# Patient Record
Sex: Male | Born: 1960
Health system: Southern US, Community
[De-identification: ages and names within clinical notes are randomized; demographics above are authoritative.]

## PROBLEM LIST (undated history)

## (undated) DIAGNOSIS — H538 Other visual disturbances: Secondary | ICD-10-CM

## (undated) DIAGNOSIS — K625 Hemorrhage of anus and rectum: Secondary | ICD-10-CM

## (undated) DIAGNOSIS — N189 Chronic kidney disease, unspecified: Secondary | ICD-10-CM

## (undated) DIAGNOSIS — K5790 Diverticulosis of intestine, part unspecified, without perforation or abscess without bleeding: Secondary | ICD-10-CM

## (undated) DIAGNOSIS — C61 Malignant neoplasm of prostate: Secondary | ICD-10-CM

## (undated) DIAGNOSIS — I10 Essential (primary) hypertension: Secondary | ICD-10-CM

## (undated) DIAGNOSIS — K649 Unspecified hemorrhoids: Secondary | ICD-10-CM

## (undated) HISTORY — PX: OTHER SURGICAL HISTORY: SHX169

## (undated) HISTORY — DX: Unspecified hemorrhoids: K64.9

## (undated) HISTORY — PX: CIRCUMCISION: SUR203

## (undated) HISTORY — DX: Hemorrhage of anus and rectum: K62.5

## (undated) HISTORY — DX: Morbid (severe) obesity due to excess calories: E66.01

## (undated) HISTORY — DX: Other visual disturbances: H53.8

## (undated) HISTORY — DX: Diverticulosis of intestine, part unspecified, without perforation or abscess without bleeding: K57.90

---

## 2006-06-28 ENCOUNTER — Emergency Department (HOSPITAL_COMMUNITY): Admission: EM | Admit: 2006-06-28 | Discharge: 2006-06-28 | Payer: Self-pay | Admitting: *Deleted

## 2011-08-08 ENCOUNTER — Encounter (HOSPITAL_COMMUNITY): Payer: Self-pay | Admitting: Emergency Medicine

## 2011-08-08 ENCOUNTER — Emergency Department (HOSPITAL_COMMUNITY): Payer: Self-pay

## 2011-08-08 ENCOUNTER — Emergency Department (HOSPITAL_COMMUNITY)
Admission: EM | Admit: 2011-08-08 | Discharge: 2011-08-08 | Disposition: A | Discharge status: Home / Self Care | Payer: Self-pay | Attending: Emergency Medicine | Admitting: Emergency Medicine

## 2011-08-08 DIAGNOSIS — M25469 Effusion, unspecified knee: Secondary | ICD-10-CM | POA: Insufficient documentation

## 2011-08-08 DIAGNOSIS — S8990XA Unspecified injury of unspecified lower leg, initial encounter: Secondary | ICD-10-CM

## 2011-08-08 DIAGNOSIS — M25569 Pain in unspecified knee: Secondary | ICD-10-CM | POA: Insufficient documentation

## 2011-08-08 MED ORDER — HYDROCODONE-ACETAMINOPHEN 5-325 MG PO TABS: 1.0000 | ORAL_TABLET | ORAL | Status: AC | PRN

## 2011-08-08 NOTE — Discharge Instructions (Signed)
Knee Effusion  Knee effusion means you have fluid in your knee. The knee may be more difficult to bend and move. HOME CARE  Use crutches or a brace as told by your doctor.   Put ice on the injured area.   Put ice in a plastic bag.   Place a towel between your skin and the bag.   Leave the ice on for 15 to 20 minutes, 3 to 4 times a day.   Raise (elevate) your knee as much as possible.   Only take medicine as told by your doctor.   You may need to do strengthening exercises. Ask your doctor.   Continue with your normal diet and activities as told by your doctor.  GET HELP RIGHT AWAY IF:  You have more puffiness (swelling) in your knee.   You see redness, puffiness, or have more pain in your knee.   You have a temperature by mouth above 102 F (38.9 C).   You get a rash.   You have trouble breathing.   You have a reaction to any medicine you are taking.   You have a lot of pain when you move your knee.  MAKE SURE YOU:  Understand these instructions.   Will watch your condition.   Will get help right away if you are not doing well or get worse.  Document Released: 03/21/2010 Document Revised: 02/05/2011 Document Reviewed: 03/21/2010 [employer] Patient Information 2012 [employer]. 

## 2011-08-08 NOTE — ED Notes (Signed)
Pt. Stated, knee pain that started on tuesday

## 2011-08-08 NOTE — ED Notes (Signed)
Pt d/c home in NAD. Pt voiced understanding of d/c instructions and follow up care.  

## 2011-08-08 NOTE — ED Notes (Signed)
Paged Ortho  

## 2011-08-08 NOTE — ED Provider Notes (Signed)
History   This chart was scribed for Flint Melter, MD by Shari Heritage. The patient was seen in room STRE4/STRE4. Patient's care was started at 1355.     CSN: [id number]  Arrival date & time 08/08/11  1355   First MD Initiated Contact with Patient 08/08/11 1532      Chief Complaint  Patient presents with  . Knee Pain    (Consider location/radiation/quality/duration/timing/severity/associated sxs/prior treatment) Patient is a 51 y.o. male presenting with knee pain. The history is provided by the patient. No language interpreter was used.  Knee Pain   Charles [name] is a 51 y.o. male who presents to the Emergency Department complaining of moderate to severe, constant right knee pain onset 4 days ago. Patient also complains of some swelling in knee. Patient denies any trauma or injury to the knee. 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 Patient reports no other pertinent medical or surgical history.  History reviewed. No pertinent past medical history.  History reviewed. No pertinent past surgical history.  No family history on file.  History  Substance Use Topics  . Smoking status: Not on file  . Smokeless tobacco: Not on file  . Alcohol Use: 0.6 oz/week    1 Cans of beer per week      Review of Systems A complete 10 system review of systems was obtained and all systems are negative except as noted in the HPI and PMH.   Allergies  Review of patient's allergies indicates no known allergies.  Home Medications   Current Outpatient Rx  Name Route Sig Dispense Refill  . HYDROCODONE-ACETAMINOPHEN 5-325 MG PO TABS Oral Take 1 tablet by mouth every 4 (four) hours as needed for pain. 10 tablet 0    BP 121/72  Pulse 76  Temp(Src) 98.2 F (36.8 C) (Oral)  Resp 16  SpO2 97%  Physical Exam  Nursing note and vitals reviewed. Constitutional: He is oriented to person, place, and time. He appears well-developed and well-nourished. No  distress.  HENT:  Head: Normocephalic and atraumatic.  Eyes: Conjunctivae and EOM are normal.  Neck: Neck supple. No tracheal deviation present.  Cardiovascular: Normal rate.   Pulmonary/Chest: Effort normal. No respiratory distress.  Abdominal: He exhibits no distension.  Musculoskeletal: Normal range of motion. He exhibits tenderness.       Small effusion in right leg surrounding the knee. Diffuse anterior tenderness and pain with passive flexion. No posterior tenderness. Grossly stable.  Neurological: He is alert and oriented to person, place, and time. No sensory deficit.  Skin: Skin is dry.  Psychiatric: He has a normal mood and affect. His behavior is normal.    ED Course  Procedures (including critical care time) DIAGNOSTIC STUDIES: Oxygen Saturation is 97% on room air, adequate by my interpretation.    COORDINATION OF CARE: 3:53PM - Patient informed of current plan for treatment and evaluation and agrees with plan at this time. Will order X-ray of right leg.   Emergency department treatment: He was offered a knee immobilizer, but declined. A knee sleeve is applied  Labs Reviewed - No data to display Dg Knee Complete 4 Views Right  08/08/2011  *RADIOLOGY REPORT*  Clinical Data: Pain and swelling in the right knee.  No known injury.  RIGHT KNEE - COMPLETE 4+ VIEW  Comparison: None.  Findings: The knee is aligned.  There is a tiny patellar osteophyte.  The medial and lateral joint spaces are maintained. No fracture, focal  bony abnormality, or joint effusion is identified.  There is suggestion of some soft tissue swelling in the infrapatellar region.  IMPRESSION:  1.  No acute bony abnormality or joint effusion. 2.  Tiny patellar osteophyte. 3.  Question soft tissue swelling inferior to the patella.  Original Report Authenticated By: Britta Mccreedy, M.D.     1. Knee injuries       MDM  Nonspecific knee effusion, probably related to an occult injury. Doubt fracture, or septic  arthritis DVT or radiculopathy. Doubt metabolic instability, serious bacterial infection or impending vascular collapse; the patient is stable for discharge.      I personally performed the services described in this documentation, which was scribed in my presence. The recorded information has been reviewed and considered.   Plan: Home Medications- Norco; Home Treatments- avoid knee stress; Recommended follow up- Ortho prn  Flint Melter, MD 08/08/11 2024

## 2011-08-08 NOTE — Progress Notes (Signed)
Orthopedic Tech Progress Note Patient Details:  Charles [name] 1960-04-04 [id number]  Ortho Devices Type of Ortho Device: Knee Sleeve Ortho Device/Splint Location: right knee Ortho Device/Splint Interventions: Freeman Caldron, Briannia Laba 08/08/2011, 5:19 PM

## 2015-02-06 ENCOUNTER — Encounter: Payer: Self-pay | Admitting: Internal Medicine

## 2015-03-20 ENCOUNTER — Encounter (HOSPITAL_COMMUNITY): Payer: Self-pay | Admitting: Emergency Medicine

## 2015-03-20 ENCOUNTER — Emergency Department (HOSPITAL_COMMUNITY)
Admission: EM | Admit: 2015-03-20 | Discharge: 2015-03-21 | Disposition: A | Discharge status: Home / Self Care | Payer: Commercial Managed Care - HMO | Attending: Emergency Medicine | Admitting: Emergency Medicine

## 2015-03-20 ENCOUNTER — Emergency Department (HOSPITAL_COMMUNITY): Payer: Commercial Managed Care - HMO

## 2015-03-20 DIAGNOSIS — N400 Enlarged prostate without lower urinary tract symptoms: Secondary | ICD-10-CM | POA: Insufficient documentation

## 2015-03-20 DIAGNOSIS — R599 Enlarged lymph nodes, unspecified: Secondary | ICD-10-CM | POA: Diagnosis not present

## 2015-03-20 DIAGNOSIS — K625 Hemorrhage of anus and rectum: Secondary | ICD-10-CM | POA: Diagnosis present

## 2015-03-20 LAB — COMPREHENSIVE METABOLIC PANEL
ALBUMIN: 3.9 g/dL (ref 3.5–5.0)
ALT: 19 U/L (ref 17–63)
ANION GAP: 10 (ref 5–15)
AST: 20 U/L (ref 15–41)
Alkaline Phosphatase: 59 U/L (ref 38–126)
BUN: 14 mg/dL (ref 6–20)
CHLORIDE: 107 mmol/L (ref 101–111)
CO2: 25 mmol/L (ref 22–32)
Calcium: 9.2 mg/dL (ref 8.9–10.3)
Creatinine, Ser: 1 mg/dL (ref 0.61–1.24)
GFR calc Af Amer: >60 mL/min (ref >60)
GFR calc non Af Amer: >60 mL/min (ref >60)
GLUCOSE: 110 mg/dL — AB (ref 65–99)
POTASSIUM: 3.8 mmol/L (ref 3.5–5.1)
SODIUM: 142 mmol/L (ref 135–145)
TOTAL PROTEIN: 6.7 g/dL (ref 6.5–8.1)
Total Bilirubin: 0.2 mg/dL — ABNORMAL LOW (ref 0.3–1.2)

## 2015-03-20 LAB — CBC
HEMATOCRIT: 38.5 % — AB (ref 39.0–52.0)
HEMOGLOBIN: 13.5 g/dL (ref 13.0–17.0)
MCH: 33.1 pg (ref 26.0–34.0)
MCHC: 35.1 g/dL (ref 30.0–36.0)
MCV: 94.4 fL (ref 78.0–100.0)
Platelets: 294 10*3/uL (ref 150–400)
RBC: 4.08 MIL/uL — ABNORMAL LOW (ref 4.22–5.81)
RDW: 12.7 % (ref 11.5–15.5)
WBC: 7.4 10*3/uL (ref 4.0–10.5)

## 2015-03-20 LAB — ABO/RH: ABO/RH(D): B POS

## 2015-03-20 LAB — TYPE AND SCREEN
ABO/RH(D): B POS
ANTIBODY SCREEN: NEGATIVE

## 2015-03-20 LAB — POC OCCULT BLOOD, ED: Fecal Occult Bld: NEGATIVE

## 2015-03-20 MED ORDER — SODIUM CHLORIDE 0.9 % IV SOLN
INTRAVENOUS | Status: DC
  Administered 2015-03-20: 20:00:00 via INTRAVENOUS

## 2015-03-20 MED ORDER — IOHEXOL 300 MG/ML  SOLN
100.0000 mL | Freq: Once | INTRAMUSCULAR | Status: AC | PRN
  Administered 2015-03-20: 100 mL via INTRAVENOUS

## 2015-03-20 NOTE — ED Provider Notes (Signed)
CSN: [id number]     Arrival date & time 03/20/15  1351 History   First MD Initiated Contact with Patient 03/20/15 2000     Chief Complaint  Patient presents with  . Rectal Bleeding      HPI Pt was seen at 2010. Per pt, c/o gradual onset and persistence of intermittent "rectal bleeding" for the past 6+ months. Pt states he will alternate between "red" rectal bleeding and "dark stools," as well as "rectal discharge." Denies any change in his symptoms. Denies new symptoms. Denies rectal pain, no rectal injury, no fecal incont, no abd pain, no N/V/D, no back pain, no fevers, no rash.    History reviewed. No pertinent past medical history.   History reviewed. No pertinent past surgical history.  Social History  Substance Use Topics  . Smoking status: Never Smoker   . Smokeless tobacco: None  . Alcohol Use: 0.6 oz/week    1 Cans of beer per week    Review of Systems ROS: Statement: All systems negative except as marked or noted in the HPI; Constitutional: Negative for fever and chills. ; ; Eyes: Negative for eye pain, redness and discharge. ; ; ENMT: Negative for ear pain, hoarseness, nasal congestion, sinus pressure and sore throat. ; ; Cardiovascular: Negative for chest pain, palpitations, diaphoresis, dyspnea and peripheral edema. ; ; Respiratory: Negative for cough, wheezing and stridor. ; ; Gastrointestinal: Negative for nausea, vomiting, diarrhea, abdominal pain, hematemesis, jaundice and +rectal bleeding, dark stools, rectal drainage.; ; Genitourinary: Negative for dysuria, flank pain and hematuria. ; ; Musculoskeletal: Negative for back pain and neck pain. Negative for swelling and trauma.; ; Skin: Negative for pruritus, rash, abrasions, blisters, bruising and skin lesion.; ; Neuro: Negative for headache, lightheadedness and neck stiffness. Negative for weakness, altered level of consciousness , altered mental status, extremity weakness, paresthesias, involuntary movement, seizure and  syncope.      Allergies  Review of patient's allergies indicates no known allergies.  Home Medications   Prior to Admission medications   Not on File   BP 152/99 mmHg  Pulse 81  Temp(Src) 98.1 F (36.7 C) (Oral)  Resp 18  Ht 5\' 6"  (1.676 m)  Wt 200 lb (90.719 kg)  BMI 32.30 kg/m2  SpO2 97% Physical Exam  2015: Physical examination:  Nursing notes reviewed; Vital signs and O2 SAT reviewed;  Constitutional: Well developed, Well nourished, Well hydrated, In no acute distress; Head:  Normocephalic, atraumatic; Eyes: EOMI, PERRL, No scleral icterus; ENMT: Mouth and pharynx normal, Mucous membranes moist; Neck: Supple, Full range of motion, No lymphadenopathy; Cardiovascular: Regular rate and rhythm, No gallop; Respiratory: Breath sounds clear & equal bilaterally, No wheezes.  Speaking full sentences with ease, Normal respiratory effort/excursion; Chest: Nontender, Movement normal; Abdomen: Soft, Nontender, Nondistended, Normal bowel sounds; Genitourinary: No CVA tenderness; Spine:  No midline CS, TS, LS tenderness.;; Extremities: Pulses normal, No tenderness, No edema, No calf edema or asymmetry.; Neuro: AA&Ox3, Major CN grossly intact.  Speech clear. No gross focal motor or sensory deficits in extremities.; Skin: Color normal, Warm, Dry.   ED Course  Procedures (including critical care time) Labs Review   Imaging Review  I have personally reviewed and evaluated these images and lab results as part of my medical decision-making.   EKG Interpretation None      MDM  MDM Reviewed: previous chart, nursing note and vitals Reviewed previous: labs Interpretation: labs and CT scan     Results for orders placed or performed during the hospital encounter  of 03/20/15  Comprehensive metabolic panel  Result Value Ref Range   Sodium 142 135 - 145 mmol/L   Potassium 3.8 3.5 - 5.1 mmol/L   Chloride 107 101 - 111 mmol/L   CO2 25 22 - 32 mmol/L   Glucose, Bld 110 (H) 65 - 99 mg/dL    BUN 14 6 - 20 mg/dL   Creatinine, Ser 1.00 0.61 - 1.24 mg/dL   Calcium 9.2 8.9 - 10.3 mg/dL   Total Protein 6.7 6.5 - 8.1 g/dL   Albumin 3.9 3.5 - 5.0 g/dL   AST 20 15 - 41 U/L   ALT 19 17 - 63 U/L   Alkaline Phosphatase 59 38 - 126 U/L   Total Bilirubin 0.2 (L) 0.3 - 1.2 mg/dL   GFR calc non Af Amer >60 >60 mL/min   GFR calc Af Amer >60 >60 mL/min   Anion gap 10 5 - 15  CBC  Result Value Ref Range   WBC 7.4 4.0 - 10.5 K/uL   RBC 4.08 (L) 4.22 - 5.81 MIL/uL   Hemoglobin 13.5 13.0 - 17.0 g/dL   HCT 38.5 (L) 39.0 - 52.0 %   MCV 94.4 78.0 - 100.0 fL   MCH 33.1 26.0 - 34.0 pg   MCHC 35.1 30.0 - 36.0 g/dL   RDW 12.7 11.5 - 15.5 %   Platelets 294 150 - 400 K/uL  POC occult blood, ED  Result Value Ref Range   Fecal Occult Bld NEGATIVE NEGATIVE  Type and screen [place]  Result Value Ref Range   ABO/RH(D) B POS    Antibody Screen NEG    Sample Expiration 03/23/2015   ABO/Rh  Result Value Ref Range   ABO/RH(D) B POS     Ct Abdomen Pelvis W Contrast 03/20/2015  CLINICAL DATA:  Chronic bright red blood in stool, with sensation of rectal knot. Initial encounter. EXAM: CT ABDOMEN AND PELVIS WITH CONTRAST TECHNIQUE: Multidetector CT imaging of the abdomen and pelvis was performed using the standard protocol following bolus administration of intravenous contrast. CONTRAST:  147mL OMNIPAQUE IOHEXOL 300 MG/ML  SOLN COMPARISON:  None. FINDINGS: The visualized lung bases are clear. Small hepatic hypodensities measure up to 1.1 cm in size, nonspecific in appearance. The gallbladder is within normal limits. The pancreas and adrenal glands are unremarkable. Scattered bilateral renal cysts are noted, measuring up to 4.3 cm in size, with mild distortion of the renal parenchyma. The kidneys are otherwise grossly unremarkable. There is no evidence of hydronephrosis. No renal or ureteral stones are seen. No perinephric stranding is appreciated. No free fluid is identified. The small  bowel is unremarkable in appearance. The stomach is within normal limits. No acute vascular abnormalities are seen. The appendix is normal in caliber and filled with air, without evidence of appendicitis. The colon is grossly unremarkable in appearance. There is an unusual 1.4 cm node adjacent to the rectum, on the left side. The anorectal canal is grossly unremarkable in appearance. The bladder is mildly distended and grossly unremarkable. The prostate is enlarged, measuring 5.6 cm in diameter, with impression on the base of the bladder. No inguinal lymphadenopathy is seen. No acute osseous abnormalities are identified. A small bone cyst is noted within the right ilium. IMPRESSION: 1. Unusual 1.4 cm enlarged node adjacent to the rectum, on the left side. No primary malignancy is seen. Given the unusual location of this node, and the patient's symptoms, PET/CT is recommended for further evaluation, to assess for underlying malignancy.  Alternatively, biopsy could be considered if deemed clinically appropriate. 2. The anorectal canal is unremarkable in appearance. The rectum and colon are grossly unremarkable. No definite source for the patient's bright red blood per rectum seen on CT. 3. Enlarged prostate noted, impressing on the base of the bladder. Would correlate with PSA. 4. Bilateral renal cysts noted. 5. Small nonspecific hypodensities within the liver measure up to 1.1 cm. Electronically Signed   By: Garald Balding M.D.   On: 03/20/2015 23:31    0020:  Pt denies any change in his symptoms or any new symptoms. States he "just decided to come get checked out today." Labs reassuring. Stool occult blood negative. WBC count and H/H normal. CT scan with enlarged lymph node; will need f/u GI MD. Will also need Uro MD f/u for enlarged prostate. Pt denies any urinary symptoms (specifically dysuria, incont, retention). Dx and testing d/w pt.  Questions answered.  Verb understanding, agreeable to d/c home with outpt  f/u.      Francine Graven, DO 03/23/15 2334

## 2015-03-20 NOTE — ED Notes (Signed)
Called pt to be triaged, no response.  

## 2015-03-20 NOTE — ED Notes (Signed)
Called pt to be triaged 2X, no response 

## 2015-03-20 NOTE — ED Notes (Signed)
Pt states he started having bright red blood in his stool over the last few months. Pt states over the last few days blood in stool has been for frequent. Pt states he also felt a knot at his rectum when he wipes. Pt denies any abd pain or n/v.

## 2015-03-21 NOTE — Discharge Instructions (Signed)
°Emergency Department Resource Guide °1) Find a Doctor and Pay Out of Pocket °Although you won't have to find out who is covered by your insurance plan, it is a good idea to ask around and get recommendations. You will then need to call the office and see if the doctor you have chosen will accept you as a new patient and what types of options they offer for patients who are self-pay. Some doctors offer discounts or will set up payment plans for their patients who do not have insurance, but you will need to ask so you aren't surprised when you get to your appointment. ° °2) Contact Your Local Health Department °Not all health departments have doctors that can see patients for sick visits, but many do, so it is worth a call to see if yours does. If you don't know where your local health department is, you can check in your phone book. The CDC also has a tool to help you locate your state's health department, and many state websites also have listings of all of their local health departments. ° °3) Find a Walk-in Clinic °If your illness is not likely to be very severe or complicated, you may want to try a walk in clinic. These are popping up all over the country in pharmacies, drugstores, and shopping centers. They're usually staffed by nurse practitioners or physician assistants that have been trained to treat common illnesses and complaints. They're usually fairly quick and inexpensive. However, if you have serious medical issues or chronic medical problems, these are probably not your best option. ° °No Primary Care Doctor: °- Call [facility] Connect at  832-8000 - they can help you locate a primary care doctor that  accepts your insurance, provides certain services, etc. °- Physician Referral Service- 1-800-533-3463 ° °Chronic Pain Problems: °Organization         Address  Phone   Notes  °[facility]  (336) 297-2271 Patients need to be referred by their primary care doctor.  ° °Medication  Assistance: °Organization         Address  Phone   Notes  °[place] [facility] [address] °[place], [place] [address] (336) 641-8030 --Must be a resident of [place] °-- Must have NO insurance coverage whatsoever (no Medicaid/ Medicare, etc.) °-- The pt. MUST have a primary care doctor that directs their care regularly and follows them in the community °  °[facility]  (866) 331-1348   °[employer]  (888) 892-1162   ° °Agencies that provide inexpensive medical care: °Organization         Address  Phone   Notes  °[facility]  (336) 832-8035   °[facility]    (336) 832-7272   °[facility] [address] °[place], [place] [address] (336) 832-4777   °[facility] [address], °[place] (336) 271-4999   °[facility]    (336) 373-0678   °[facility]    (336) 272-1050   °[facility] ° [address], [place] Phone:  (336) 832-4444, Fax:  (336) 832-4440 Hours of Operation:  9 am - 6 pm, M-F.  Also accepts Medicaid/Medicare and self-pay.  °[facility] ° [address],  Phone: (336) 832-3150, Fax: (336) 832-3151. Hours of Operation:  8:30 am - 5:30 pm, M-F.  Also accepts Medicaid and self-pay.  °[facility] [place] [address], [place] Phone: (336) 878-6027   °[facility] [address], [place], [place] (336)723-1848, Ext. 123 Mondays & Thursdays: 7-9 AM.  First 15 patients are seen on a first come, first serve basis. °  ° °Medicaid-accepting [place] Providers: ° °Organization         Address  Phone   Notes  °[facility] [address], [place] (336) 641-2100 Also accepts self-pay patients.  °[facility] [address], [place] ° (336) 856-9996   °[facility] [address], [place]  (336) 288-8857   °[facility] [address] [place] [address], [place] (336) 299-7000   °Veita Bland [address], [place]  ° (336) 373-1557 Only accepts [place] Access Medicaid patients after they have their name applied to their card.  ° °Self-Pay (no insurance) in [place]: ° °Organization         Address  Phone   Notes  °Sickle Cell Patients, [facility] [address], [place] (336) 832-1970   °[facility] [address], [place] (336) 832-4400   °[facility] ° [address], [place] (336) 992-4800   °[facility]/Dr. Osei-Bonsu ° [address] [place] [address], [place] or [address], [place] (336) 841-8500 Phone number for both [place] and [place] locations is the same.  °[facility] [address], [place] (336) 299-0000   °[facility] [address] [place] [address], [place] or [address] (336) 852-7530 °(336) 878-2260   °[facility] [address], [place] (336) 350-1642, phone; (336) 294-5005, fax Sees patients 1st and 3rd Saturday of every month.  Must not qualify for public or private insurance (i.e. Medicaid, Medicare, [place] Health Choice, Veterans' Benefits) • Household income should be no more than 200% of the poverty level •The clinic cannot treat you if you are pregnant or think you are pregnant • Sexually transmitted diseases are not treated at the clinic.  ° ° °Dental Care: °Organization         Address  Phone  Notes  °[place] [facility] [address], [place] (336) 641-6152 Accepts children up to age 21 who are enrolled in Medicaid or [place] Health Choice; pregnant women with a Medicaid card; and children who have applied for Medicaid or [place] Health Choice, but were declined, whose parents can pay a reduced fee at time of service.  °[place]  [facility] [place]  [address], [place] (336) 641-7733 Accepts children up to age 21 who are enrolled in Medicaid or [place] Health Choice; pregnant women with a Medicaid card; and children who have applied for Medicaid or  Health Choice, but were declined, whose parents can pay a reduced fee at time of service.  °[facility] ° [address], [place] (336) 641-4533 Patients are seen by appointment only. Walk-ins are not accepted. [facility] will see patients 18 years of age and older. °Monday - Tuesday (8am-5pm) °Most Wednesdays (8:30-5pm) °$30 per visit, cash only  °[facility] ° [address], [place] (336) 641-4533 Patients are seen by appointment only. Walk-ins are not accepted. [facility] will see patients 18 years of age and older. °One   Wednesday Evening (Monthly: Volunteer Based).  $30 per visit, cash only  °[facility]  (919) 537-3737 for adults; Children under age 4, call Graduate Pediatric Dentistry at (919) [phone]. Children aged 4-14, please call (919) [phone] to request a pediatric application. ° Dental services are provided in all areas of dental care including fillings, crowns and bridges, complete and partial dentures, implants, gum treatment, root canals, and extractions. Preventive care is also provided. Treatment is provided to both adults and children. °Patients are selected via a lottery and there is often a waiting list. °  °[facility] [address], °[place] ° (336) 763-8833 [email or web address] °  °[facility] [address], [place], [place] (336)723-1848, Ext. 123 Second and Fourth Thursday of each month, opens at 6:30 AM; Clinic ends at 9 AM.  Patients are seen on a first-come first-served basis, and a limited number are seen during each clinic.  ° °[facility] ° [address], [place], [place] (336) 723-7904    Eligibility Requirements °You must have lived in [place], [place], or [place] counties for at least the last three months. °  You cannot be eligible for state or federal sponsored healthcare insurance, including Veterans Administration, Medicaid, or Medicare. °  You generally cannot be eligible for healthcare insurance through your employer.  °  How to apply: °Eligibility screenings are held every Tuesday and Wednesday afternoon from 1:00 pm until 4:00 pm. You do not need an appointment for the interview!  °[facility] [address], [place], [place] 336-631-2330   °[facility]  336-342-8273   °[facility]  336-703-3100   °[facility]  336-570-6415   ° °Behavioral Health Resources in the Community: °Intensive Outpatient Programs °Organization         Address  Phone  Notes  °[facility] [address], [place], [place] 336-878-6098   °[facility] [address], [place], [place] 336-832-9800   °[facility] [address], [place], [place] ° 336-882-2125   °[facility] [address],  °[place], [place] 1-800-853-5163 or 336-641-4981   °Substance Abuse Resources °Organization         Address  Phone  Notes  °[facility]  336-882-2125   °[facility]  336-784-9470   °[facility]  336-285-9073   °[facility]  336-845-3988   °Residential & Outpatient Substance Abuse Program  1-800-659-3381   °Psychological Services °Organization         Address  Phone  Notes  °[facility]  336- 832-9600   °[facility]  336- 378-7881   °[facility] [address], [place] 1-800-853-5163 or 336-641-4981   ° °Mobile Crisis Teams °Organization         Address  Phone  Notes  °[facility], Mobile Crisis Care Unit  1-877-626-1772   °Assertive °[facility] ° [address]  [place], [place] 336-834-9664   °Sharon DeEsch [address] °[place] 336-554-5454   ° °Self-Help/Support Groups °Organization         Address  Phone             Notes  °[facility] - variety of support groups  336- 373-1402 Call for more information  °Narcotics Anonymous (NA), [facility] [address], °[place]   2 meetings at this location  ° °  Residential Treatment Programs Organization         Address  Phone  Notes  [facility] [address]    [place]  1-801-553-5813   [facility]  [address] [place] [id number], [place], [place]   [place], [place] 830-861-1408 Admissions: 8am-3pm M-F  [facility] [place] [address],    [place], [place] [phone]   [facility] [address] [place], [place], [place]   [facility] [address]  [place], [place]   [facility] Intensive Outpatient [address], [place], [place]   [facility] [address]  [place], [place] 1-671-699-9391 or (463) 259-1086   [facility] [address] [place], [place] Accepts Medicaid  [facility] [place] [address]  [place] 1-(424)100-9983 Substance Abuse/Addiction Treatment   [facility]         Address  Phone  Notes  [facility]  339-012-9956   Domenic Schwab, PhD [address] [place], [place]   819-302-9601 or 626-082-1365   [facility] [place], [place] (704)394-4588   [facility] [address] [place], [place] [phone] Insurance/Medicaid/sponsorship through [facility] [address] [place]                                    [place], [place] 256-205-8099 [place] [address], [place] 250-575-3353    Dr. Adele Schilder  ([phone]   [facility] Dept. 1) [address]  2) [place] 3)  [address], [place] (680)[phone]  [phone]   [place] (760) [phone] or (404)[phone] (After Hours)      Take your usual prescriptions as previously directed. Your CT scan showed: "1.4 cm enlarged node adjacent to the rectum, on the left side. No primary malignancy is seen. PET/CT is recommended for further evaluation. Alternatively, biopsy could be considered if deemed clinically appropriate."  Call the GI doctor tomorrow morning to schedule a follow up appointment within the next 3 to 4 days for this finding. Your CT scan also showed an enlarged prostate. Call the Urologist tomorrow morning to schedule a follow up appointment within the next week.  Return to the Emergency Department immediately sooner if worsening.

## 2015-03-28 ENCOUNTER — Ambulatory Visit (AMBULATORY_SURGERY_CENTER): Payer: Self-pay | Admitting: *Deleted

## 2015-03-28 ENCOUNTER — Encounter: Payer: Self-pay | Admitting: *Deleted

## 2015-03-28 VITALS — Ht 66.0 in | Wt 207.0 lb

## 2015-03-28 DIAGNOSIS — R938 Abnormal findings on diagnostic imaging of other specified body structures: Secondary | ICD-10-CM

## 2015-03-28 DIAGNOSIS — K625 Hemorrhage of anus and rectum: Secondary | ICD-10-CM

## 2015-03-28 DIAGNOSIS — R9389 Abnormal findings on diagnostic imaging of other specified body structures: Secondary | ICD-10-CM

## 2015-03-28 MED ORDER — NA SULFATE-K SULFATE-MG SULF 17.5-3.13-1.6 GM/177ML PO SOLN: 1.0000 | Freq: Once | ORAL | Status: DC

## 2015-03-28 NOTE — Progress Notes (Signed)
No egg or soy allergy known to patient  No issues with past sedation with any surgeries  or procedures, no intubation problems  No diet pills per patient No home 02 use per patient  No blood thinners per patient   emmi video [email or web address]  Pt states he has been seeing some leakage from the rectum almost like mucus

## 2015-04-11 ENCOUNTER — Encounter: Payer: Self-pay | Admitting: Internal Medicine

## 2015-04-11 ENCOUNTER — Other Ambulatory Visit: Payer: Self-pay

## 2015-04-11 ENCOUNTER — Ambulatory Visit (AMBULATORY_SURGERY_CENTER): Payer: Commercial Managed Care - HMO | Admitting: Internal Medicine

## 2015-04-11 VITALS — BP 149/119 | HR 67 | Temp 97.0°F | Resp 25 | Ht 66.0 in | Wt 207.0 lb

## 2015-04-11 DIAGNOSIS — R938 Abnormal findings on diagnostic imaging of other specified body structures: Secondary | ICD-10-CM

## 2015-04-11 DIAGNOSIS — K625 Hemorrhage of anus and rectum: Secondary | ICD-10-CM

## 2015-04-11 DIAGNOSIS — R9389 Abnormal findings on diagnostic imaging of other specified body structures: Secondary | ICD-10-CM

## 2015-04-11 MED ORDER — SODIUM CHLORIDE 0.9 % IV SOLN: 500.0000 mL | INTRAVENOUS | Status: DC

## 2015-04-11 MED ORDER — DISPOSABLE ENEMA 19-7 GM/118ML RE ENEM
1.0000 | ENEMA | RECTAL | Status: AC
  Administered 2015-04-11: 1 via RECTAL

## 2015-04-11 NOTE — Progress Notes (Signed)
While checking in pt, he reported he drank mag citrate last night at 6:00 pm and drank a second bottle this am at 5:00 am.  Pt said his last result was clear liquid.  Margaretmary Eddy, RN reported this to Dr. Hilarie Fredrickson.  Per Dr. Hilarie Fredrickson administer 1 enema and let one of the nurses look at results.  Pt instructed on how to administer enema.  maw

## 2015-04-11 NOTE — Progress Notes (Signed)
A/ox3 pleased with MAC, report to Celia RN 

## 2015-04-11 NOTE — Progress Notes (Signed)
Results after 1 fleet enema, per Margaretmary Eddy, RN was yellow liquid.  Per Dr. Hilarie Fredrickson we will proceed with colonoscopy.  maw

## 2015-04-11 NOTE — Op Note (Signed)
[place]  [employer] [place], [id number]   COLONOSCOPY PROCEDURE REPORT  PATIENT: Charles [name], Charles [name]  MR#: [id number] BIRTHDATE: December 02, 1960 , 33  yrs. old GENDER: male ENDOSCOPIST: Jerene Bears, MD PROCEDURE DATE:  04/11/2015 PROCEDURE:   Colonoscopy, diagnostic First Screening Colonoscopy - Avg.  risk and is 50 yrs.  old or older - No.  Prior Negative Screening - Now for repeat screening. N/A  History of Adenoma - Now for follow-up colonoscopy & has been > or = to 3 yrs.  N/A  Polyps removed today? No Recommend repeat exam, <10 yrs? Yes poor prep ASA CLASS:   Class II INDICATIONS:rectal bleeding, abnormal CT scan abdomen and pelvis showing perirectal lymph node pathologically enlarged. MEDICATIONS: Monitored anesthesia care and Propofol 350 mg IV  DESCRIPTION OF PROCEDURE:   After the risks benefits and alternatives of the procedure were thoroughly explained, informed consent was obtained.  The digital rectal exam revealed palpable ridge in the posterior midline suggestive of fissure. No masses. The LB [id number]  endoscope was introduced through the anus and advanced to the cecum, which was identified by both the appendix and ileocecal valve. No adverse events experienced.   The quality of the prep was fair using 2 bottles of mag citrate (patient did not take prep discussed at pre-visit) and enema once at endo center.  The instrument was then slowly withdrawn as the colon was fully examined. Estimated blood loss is zero unless otherwise noted in this procedure report.      COLON FINDINGS: The colonic mucosa appeared normal throughout the entire examined colon.  The preparation in the right colon was fair with pieces of solid stool which could not be cleared by irrigation and lavage. Limited visibility in the right colon.  There was mild diverticulosis noted in the ascending colon.  Retroflexed views revealed internal, external hemorrhoids and  hypertrophied anal papilla. Views of the anal canal suggest healing anal fissure. The time to cecum = 2.3 Withdrawal time = 15.3   The scope was withdrawn and the procedure completed.  COMPLICATIONS: There were no immediate complications.  ENDOSCOPIC IMPRESSION: 1.   Mild diverticulosis as described above, otherwise normal colonic mucosa with limited visibility in the right colon due to preparation 2.   Small internal, external hemorrhoids with hypertrophied anal papilla. Probable healing anal fissure  RECOMMENDATIONS: 1.  Would consider PET CT scan to evaluate previous seen lymph node in the perirectal area 2.  [facility] care [facility] to [facility] 3.  If PET scan unrevealing, would recommend hemorrhoidal banding for rectal bleeding and fecal smearing/seepage 4.  Repeat colonoscopy with full preparation recommended  eSigned:  Jerene Bears, MD 04/11/2015 11:00 AM   cc:  the patient   PATIENT NAME:  Charles [name], Charles [name] MR#: [id number]

## 2015-04-11 NOTE — Patient Instructions (Signed)
Discharge instructions given. Handouts on diverticulosis and hemorrhoids. Resume previous medications. YOU HAD AN ENDOSCOPIC PROCEDURE TODAY AT THE [facility]:   Refer to the procedure report that was given to you for any specific questions about what was found during the examination.  If the procedure report does not answer your questions, please call your gastroenterologist to clarify.  If you requested that your care partner not be given the details of your procedure findings, then the procedure report has been included in a sealed envelope for you to review at your convenience later.  YOU SHOULD EXPECT: Some feelings of bloating in the abdomen. Passage of more gas than usual.  Walking can help get rid of the air that was put into your GI tract during the procedure and reduce the bloating. If you had a lower endoscopy (such as a colonoscopy or flexible sigmoidoscopy) you may notice spotting of blood in your stool or on the toilet paper. If you underwent a bowel prep for your procedure, you may not have a normal bowel movement for a few days.  Please Note:  You might notice some irritation and congestion in your nose or some drainage.  This is from the oxygen used during your procedure.  There is no need for concern and it should clear up in a day or so.  SYMPTOMS TO REPORT IMMEDIATELY:   Following lower endoscopy (colonoscopy or flexible sigmoidoscopy):  Excessive amounts of blood in the stool  Significant tenderness or worsening of abdominal pains  Swelling of the abdomen that is new, acute  Fever of 100F or higher   For urgent or emergent issues, a gastroenterologist can be reached at any hour by calling [phone].   DIET: Your first meal following the procedure should be a small meal and then it is ok to progress to your normal diet. Heavy or fried foods are harder to digest and may make you feel nauseous or bloated.  Likewise, meals heavy in dairy and vegetables can  increase bloating.  Drink plenty of fluids but you should avoid alcoholic beverages for 24 hours.  ACTIVITY:  You should plan to take it easy for the rest of today and you should NOT DRIVE or use heavy machinery until tomorrow (because of the sedation medicines used during the test).    FOLLOW UP: Our staff will call the number listed on your records the next business day following your procedure to check on you and address any questions or concerns that you may have regarding the information given to you following your procedure. If we do not reach you, we will leave a message.  However, if you are feeling well and you are not experiencing any problems, there is no need to return our call.  We will assume that you have returned to your regular daily activities without incident.  If any biopsies were taken you will be contacted by phone or by letter within the next 1-3 weeks.  Please call us at [phone] if you have not heard about the biopsies in 3 weeks.    SIGNATURES/CONFIDENTIALITY: You and/or your care partner have signed paperwork which will be entered into your electronic medical record.  These signatures attest to the fact that that the information above on your After Visit Summary has been reviewed and is understood.  Full responsibility of the confidentiality of this discharge information lies with you and/or your care-partner. 

## 2015-04-12 ENCOUNTER — Telehealth: Payer: Self-pay

## 2015-04-12 NOTE — Telephone Encounter (Signed)
  Follow up Call-  Call back number 04/11/2015  Post procedure Call Back phone  # [phone] cell  Permission to leave phone message Yes     Patient questions:  Do you have a fever, pain , or abdominal swelling? No. Pain Score  0 *  Have you tolerated food without any problems? Yes.    Have you been able to return to your normal activities? Yes.    Do you have any questions about your discharge instructions: Diet   No. Medications  No. Follow up visit  No.  Do you have questions or concerns about your Care? No.  Actions: * If pain score is 4 or above: No action needed, pain <4.

## 2015-04-16 ENCOUNTER — Other Ambulatory Visit: Payer: Self-pay

## 2015-04-16 ENCOUNTER — Telehealth: Payer: Self-pay

## 2015-04-16 DIAGNOSIS — R599 Enlarged lymph nodes, unspecified: Secondary | ICD-10-CM

## 2015-04-16 DIAGNOSIS — R9389 Abnormal findings on diagnostic imaging of other specified body structures: Secondary | ICD-10-CM

## 2015-04-16 DIAGNOSIS — Z7689 Persons encountering health services in other specified circumstances: Secondary | ICD-10-CM

## 2015-04-16 NOTE — Telephone Encounter (Signed)
Pt scheduled for PET scan at [facility] for abnormal CT scan to evaluate lymph node in perirectal area. PET scan scheduled for 04/24/15@7am , pt to arrive there at 6:30am. Pt to be NPO after midnight. Pt aware of appt.

## 2015-04-19 ENCOUNTER — Encounter: Payer: Self-pay | Admitting: *Deleted

## 2015-04-24 ENCOUNTER — Ambulatory Visit (HOSPITAL_COMMUNITY): Payer: Commercial Managed Care - HMO

## 2015-04-30 ENCOUNTER — Telehealth: Payer: Self-pay | Admitting: *Deleted

## 2015-04-30 NOTE — Telephone Encounter (Signed)
Dr. Hilarie Fredrickson,  Would you like a recall entered for this patient? If so, when?

## 2015-04-30 NOTE — Telephone Encounter (Signed)
He needs repeat colon now due to prep -- 2 day prep I have cc:ed Dottie

## 2015-05-01 NOTE — Telephone Encounter (Signed)
Colon scheduled for 05/27/15. He will be instructed at upcoming banding appointment.

## 2015-05-03 ENCOUNTER — Encounter: Payer: Commercial Managed Care - HMO | Admitting: Internal Medicine

## 2015-05-06 ENCOUNTER — Telehealth: Payer: Self-pay | Admitting: *Deleted

## 2015-05-06 NOTE — Telephone Encounter (Signed)
Patient no showed for hemorrhoidal banding last Friday at which time he was to be instructed on colonoscopy. He also cancelled his PET that he was previously scheduled for. I have contacted patient to discuss and he states that he would like to call me back at a later time.

## 2015-05-06 NOTE — Telephone Encounter (Signed)
-----   Message from Larina Bras, [place] sent at 05/01/2015  1:15 PM EST ----- Was patient instructed on 3/27 colon?

## 2015-05-09 NOTE — Telephone Encounter (Signed)
I have spoken to patient and have asked that he come for his hemorrhoidal banding on 05/28/15 @ 4 pm. I advised that we have rescheduled colonoscopy (explained why we needed to repeat colonoscopy due to poor prep) to 06/06/15 @ 3:30 pm with 2:30 pm arrival. I have advised that I will explain procedure to him and have him sign paperwork on 3/28 at his banding appointment. Patient verbalizes understanding and states he will come.

## 2015-05-20 ENCOUNTER — Encounter: Payer: Commercial Managed Care - HMO | Admitting: Internal Medicine

## 2015-05-27 ENCOUNTER — Encounter: Payer: Commercial Managed Care - HMO | Admitting: Internal Medicine

## 2015-05-28 ENCOUNTER — Encounter: Payer: Commercial Managed Care - HMO | Admitting: Internal Medicine

## 2015-05-28 ENCOUNTER — Telehealth: Payer: Self-pay | Admitting: *Deleted

## 2015-05-28 NOTE — Telephone Encounter (Signed)
Patient has again no showed his appointment for hemorrhoidal banding and colonoscopy prep instructions even after we talked previously. I have spoken to Dr Hilarie Fredrickson and he states that he would like me to cancel the upcoming colonoscopy procedure scheduled for 06-06-15 right now since patient has not gotten prep instructions nor has he come to any recommended appointments. He would like for me to send the patient a letter which has been done.

## 2015-06-03 ENCOUNTER — Encounter: Payer: Commercial Managed Care - HMO | Admitting: Internal Medicine

## 2015-06-06 ENCOUNTER — Encounter: Payer: Commercial Managed Care - HMO | Admitting: Internal Medicine

## 2015-06-17 ENCOUNTER — Encounter: Payer: Commercial Managed Care - HMO | Admitting: Internal Medicine

## 2016-04-02 ENCOUNTER — Telehealth: Payer: Self-pay | Admitting: Internal Medicine

## 2016-04-02 NOTE — Telephone Encounter (Signed)
LM on Vmail to CB to schedule an appt.  See Dr. Vena Rua note below and make sure patient is aware when he calls back

## 2016-04-02 NOTE — Telephone Encounter (Signed)
Charles [name] see Dr. Vena Rua note below.

## 2016-04-02 NOTE — Telephone Encounter (Signed)
Dr. Pyrtle please see note below and advise. 

## 2016-04-02 NOTE — Telephone Encounter (Signed)
Patient has not complied with my recommendations in the past including for PET CT scan, repeat colonoscopy I am okay with one more office visit to discuss his GI issues If this is a no show visit he will need to find GI care elsewhere

## 2016-06-16 ENCOUNTER — Encounter (INDEPENDENT_AMBULATORY_CARE_PROVIDER_SITE_OTHER): Payer: Self-pay

## 2016-06-16 ENCOUNTER — Encounter: Payer: Self-pay | Admitting: Internal Medicine

## 2016-06-16 ENCOUNTER — Ambulatory Visit (INDEPENDENT_AMBULATORY_CARE_PROVIDER_SITE_OTHER): Payer: Commercial Managed Care - HMO | Admitting: Internal Medicine

## 2016-06-16 VITALS — BP 126/72 | HR 80 | Ht 66.0 in | Wt 202.0 lb

## 2016-06-16 DIAGNOSIS — R933 Abnormal findings on diagnostic imaging of other parts of digestive tract: Secondary | ICD-10-CM

## 2016-06-16 DIAGNOSIS — K648 Other hemorrhoids: Secondary | ICD-10-CM

## 2016-06-16 DIAGNOSIS — R151 Fecal smearing: Secondary | ICD-10-CM

## 2016-06-16 DIAGNOSIS — K625 Hemorrhage of anus and rectum: Secondary | ICD-10-CM | POA: Diagnosis not present

## 2016-06-16 DIAGNOSIS — Z1211 Encounter for screening for malignant neoplasm of colon: Secondary | ICD-10-CM

## 2016-06-16 MED ORDER — NA SULFATE-K SULFATE-MG SULF 17.5-3.13-1.6 GM/177ML PO SOLN: ORAL | 0 refills | Status: DC

## 2016-06-16 NOTE — Progress Notes (Signed)
Subjective:    Patient ID: Charles [name], male    DOB: 10-31-1960, 56 y.o.   MRN: [id number]  HPI Charles [name] is a 56 year old male with a past medical history of colonic diverticulosis, hemorrhoids who is seen for follow-up. He was initially seen for colonoscopy on 04/11/2015 to evaluate rectal bleeding and an abnormal CT scan of the abdomen and pelvis showing perirectal lymph node which was felt to be pathologically enlarged. This procedure was limited by fair preparation which could not be cleared by irrigation and lavage. Repeat colonoscopy was recommended. We also recommended PET scan of the abdomen and pelvis to follow-up the abnormal CT scan. Neither the repeat colonoscopy or PET CT scan were performed as the patient was lost to follow-up.  Today he returns stating that his major issue continues to be a wetness that he notices in his anal canal. There is fecal smearing and leakage with straining and when working. Occasionally he will feel a "knot" with wiping but this has not increased in size. He sees intermittent rectal bleeding which is painless in nature. He denies a change in bowel habit and reports one to 2 bowel movements per day which are formed and brown. He denies constipation and diarrhea. No abdominal pain. Reports good appetite without nausea or vomiting. No weight loss. No [name] satiety. No other upper GI or hepatobiliary complaint.  Review of Systems As per history of present illness, otherwise negative  Current Medications, Allergies, Past Medical History, Past Surgical History, Family History and Social History were reviewed in [employer] record.     Objective:   Physical Exam BP 126/72   Pulse 80   Ht 5\' 6"  (1.676 m)   Wt 202 lb (91.6 kg)   BMI 32.60 kg/m  Constitutional: Well-developed and well-nourished. No distress. HEENT: Normocephalic and atraumatic. Oropharynx is clear and moist. Conjunctivae are normal.  No scleral  icterus. Neck: Neck supple. Trachea midline. Cardiovascular: Normal rate, regular rhythm and intact distal pulses. No M/R/G Pulmonary/chest: Effort normal and breath sounds normal. No wheezing, rales or rhonchi. Abdominal: Soft, nontender, nondistended. Bowel sounds active throughout. There are no masses palpable. No hepatosplenomegaly. Extremities: no clubbing, cyanosis, or edema Neurological: Alert and oriented to person place and time. Skin: Skin is warm and dry. Psychiatric: Normal mood and affect. Behavior is normal.  CT ABDOMEN AND PELVIS WITH CONTRAST   TECHNIQUE: Multidetector CT imaging of the abdomen and pelvis was performed using the standard protocol following bolus administration of intravenous contrast.   CONTRAST:  128mL OMNIPAQUE IOHEXOL 300 MG/ML  SOLN   COMPARISON:  None.   FINDINGS: The visualized lung bases are clear.   Small hepatic hypodensities measure up to 1.1 cm in size, nonspecific in appearance. The gallbladder is within normal limits. The pancreas and adrenal glands are unremarkable.   Scattered bilateral renal cysts are noted, measuring up to 4.3 cm in size, with mild distortion of the renal parenchyma. The kidneys are otherwise grossly unremarkable. There is no evidence of hydronephrosis. No renal or ureteral stones are seen. No perinephric stranding is appreciated.   No free fluid is identified. The small bowel is unremarkable in appearance. The stomach is within normal limits. No acute vascular abnormalities are seen.   The appendix is normal in caliber and filled with air, without evidence of appendicitis. The colon is grossly unremarkable in appearance.   There is an unusual 1.4 cm node adjacent to the rectum, on the left side. The anorectal canal is  grossly unremarkable in appearance.   The bladder is mildly distended and grossly unremarkable. The prostate is enlarged, measuring 5.6 cm in diameter, with impression on the base of the  bladder. No inguinal lymphadenopathy is seen.   No acute osseous abnormalities are identified. A small bone cyst is noted within the right ilium.   IMPRESSION: 1. Unusual 1.4 cm enlarged node adjacent to the rectum, on the left side. No primary malignancy is seen. Given the unusual location of this node, and the patient's symptoms, PET/CT is recommended for further evaluation, to assess for underlying malignancy. Alternatively, biopsy could be considered if deemed clinically appropriate. 2. The anorectal canal is unremarkable in appearance. The rectum and colon are grossly unremarkable. No definite source for the patient's bright red blood per rectum seen on CT. 3. Enlarged prostate noted, impressing on the base of the bladder. Would correlate with PSA. 4. Bilateral renal cysts noted. 5. Small nonspecific hypodensities within the liver measure up to 1.1 cm.     Electronically Signed   By: Garald Balding M.D.   On: 03/20/2015 23:31     Assessment & Plan:   56 year old male with a past medical history of colonic diverticulosis, hemorrhoids who is seen for follow-up.  1. Fecal smearing with intermittent rectal bleeding -- very likely secondary to symptomatic internal hemorrhoids. He would likely benefit from hemorrhoidal banding or hemorrhoidectomy. However, given his incomplete colonoscopy limited by preparation I recommend repeating this study first before hemorrhoidal banding therapy. We discussed the risk benefits and alternatives to repeating the colonoscopy and he is agreeable and wishes to proceed.  2. Colon cancer screening -- fair preparation a colonoscopy in February 2017. Repeat recommended at this time, see #1  3. Abnormal GI imaging/abnormal CT scan -- patient had a 1.4 cm enlarged node adjacent to the rectum seen on previous CT scan. I recommended repeat CT scan of the abdomen and pelvis to ensure stability of this lesion. PET scan was ordered 1 year ago which she did not  follow through with. I have chosen to repeat the CT scan as I anticipate this will be better covered than PET scan and given the long interval will be adequate to assess for stability.  Patient will be offered hemorrhoidal banding assuming no pathology discovered after repeat colon cancer screening and repeat CT abdomen and pelvis.  25 minutes spent with the patient today. Greater than 50% was spent in counseling and coordination of care with the patient

## 2016-06-16 NOTE — Patient Instructions (Addendum)
You have been scheduled for your 1st hemorrhoidal banding on Monday, 08/24/16 @ 3:30 pm.  You have been scheduled for a colonoscopy. Please follow written instructions given to you at your visit today.  Please pick up your prep supplies at the pharmacy within the next 1-3 days. If you use inhalers (even only as needed), please bring them with you on the day of your procedure. Your physician has requested that you go to www.startemmi.com and enter the access code given to you at your visit today. This web site gives a general overview about your procedure. However, you should still follow specific instructions given to you by our office regarding your preparation for the procedure.  You have been scheduled for a CT scan of the abdomen and pelvis at [facility] ([address]---this is in the same building as [facility]).   You are scheduled on 06/24/16 Wednesday at 9:30 am. You should arrive 15 minutes prior to your appointment time for registration. Please follow the written instructions below on the day of your exam:  WARNING: IF YOU ARE ALLERGIC TO IODINE/X-RAY DYE, PLEASE NOTIFY RADIOLOGY IMMEDIATELY AT [phone]! YOU WILL BE GIVEN A 13 HOUR PREMEDICATION PREP.  1) Do not eat or drink anything after 5:30 am (4 hours prior to your test) 2) You have been given 2 bottles of oral contrast to drink. The solution may taste  better if refrigerated, but do NOT add ice or any other liquid to this solution. Shake well before drinking.    Drink 1 bottle of contrast @ 7:30 am (2 hours prior to your exam)  Drink 1 bottle of contrast @ 8:30 am (1 hour prior to your exam)  You may take any medications as prescribed with a small amount of water except for the following: Metformin, Glucophage, Glucovance, Avandamet, Riomet, Fortamet, Actoplus Met, Janumet, Glumetza or Metaglip. The above medications must be held the day of the exam AND 48 hours after the exam.  The purpose of you  drinking the oral contrast is to aid in the visualization of your intestinal tract. The contrast solution may cause some diarrhea. Before your exam is started, you will be given a small amount of fluid to drink. Depending on your individual set of symptoms, you may also receive an intravenous injection of x-ray contrast/dye. Plan on being at [facility] for 30 minutes or longer, depending on the type of exam you are having performed.  This test typically takes 30-45 minutes to complete.  If you have any questions regarding your exam or if you need to reschedule, you may call the CT department at [phone] between the hours of 8:00 am and 5:00 pm, Monday-Friday.  ________________________________________________________________________

## 2016-06-24 ENCOUNTER — Inpatient Hospital Stay: Admission: RE | Admit: 2016-06-24 | Payer: Commercial Managed Care - HMO | Source: Ambulatory Visit

## 2016-07-01 ENCOUNTER — Inpatient Hospital Stay: Admission: RE | Admit: 2016-07-01 | Payer: Commercial Managed Care - HMO | Source: Ambulatory Visit

## 2016-07-15 ENCOUNTER — Inpatient Hospital Stay: Admission: RE | Admit: 2016-07-15 | Payer: Commercial Managed Care - HMO | Source: Ambulatory Visit

## 2016-07-28 ENCOUNTER — Encounter: Payer: Self-pay | Admitting: Internal Medicine

## 2016-07-31 ENCOUNTER — Ambulatory Visit (INDEPENDENT_AMBULATORY_CARE_PROVIDER_SITE_OTHER)
Admission: RE | Admit: 2016-07-31 | Discharge: 2016-07-31 | Disposition: A | Discharge status: Home / Self Care | Payer: Commercial Managed Care - HMO | Source: Ambulatory Visit | Attending: Internal Medicine | Admitting: Internal Medicine

## 2016-07-31 DIAGNOSIS — R933 Abnormal findings on diagnostic imaging of other parts of digestive tract: Secondary | ICD-10-CM | POA: Diagnosis not present

## 2016-07-31 MED ORDER — IOPAMIDOL (ISOVUE-300) INJECTION 61%
100.0000 mL | Freq: Once | INTRAVENOUS | Status: AC | PRN
  Administered 2016-07-31: 100 mL via INTRAVENOUS

## 2016-08-03 ENCOUNTER — Other Ambulatory Visit (HOSPITAL_COMMUNITY): Payer: Self-pay | Admitting: Nephrology

## 2016-08-03 ENCOUNTER — Other Ambulatory Visit: Payer: Self-pay

## 2016-08-03 DIAGNOSIS — K6289 Other specified diseases of anus and rectum: Secondary | ICD-10-CM

## 2016-08-03 DIAGNOSIS — M7989 Other specified soft tissue disorders: Secondary | ICD-10-CM

## 2016-08-06 ENCOUNTER — Telehealth: Payer: Self-pay

## 2016-08-06 NOTE — Telephone Encounter (Signed)
Pt scheduled to see Dr. Johney Maine with [facility] 09/01/16@9 :30am, pt to arrive at 9am. Pt aware of appt.

## 2016-08-07 ENCOUNTER — Telehealth: Payer: Self-pay

## 2016-08-07 NOTE — Telephone Encounter (Signed)
Pts colon cancelled, per Dr. Hilarie Fredrickson should wait for CT guided bx to be done.

## 2016-08-11 ENCOUNTER — Encounter: Payer: Commercial Managed Care - HMO | Admitting: Internal Medicine

## 2016-08-17 ENCOUNTER — Other Ambulatory Visit: Payer: Self-pay | Admitting: Radiology

## 2016-08-18 ENCOUNTER — Encounter (HOSPITAL_COMMUNITY): Payer: Self-pay

## 2016-08-18 ENCOUNTER — Ambulatory Visit (HOSPITAL_COMMUNITY)
Admission: RE | Admit: 2016-08-18 | Discharge: 2016-08-18 | Disposition: A | Discharge status: Home / Self Care | Payer: Commercial Managed Care - HMO | Source: Ambulatory Visit | Attending: Internal Medicine | Admitting: Internal Medicine

## 2016-08-18 DIAGNOSIS — K7689 Other specified diseases of liver: Secondary | ICD-10-CM | POA: Insufficient documentation

## 2016-08-18 DIAGNOSIS — K5730 Diverticulosis of large intestine without perforation or abscess without bleeding: Secondary | ICD-10-CM | POA: Insufficient documentation

## 2016-08-18 DIAGNOSIS — K625 Hemorrhage of anus and rectum: Secondary | ICD-10-CM | POA: Diagnosis not present

## 2016-08-18 DIAGNOSIS — N281 Cyst of kidney, acquired: Secondary | ICD-10-CM | POA: Diagnosis not present

## 2016-08-18 DIAGNOSIS — K6289 Other specified diseases of anus and rectum: Secondary | ICD-10-CM | POA: Diagnosis present

## 2016-08-18 LAB — CBC
HEMATOCRIT: 40.3 % (ref 39.0–52.0)
Hemoglobin: 14 g/dL (ref 13.0–17.0)
MCH: 32.4 pg (ref 26.0–34.0)
MCHC: 34.7 g/dL (ref 30.0–36.0)
MCV: 93.3 fL (ref 78.0–100.0)
Platelets: 324 10*3/uL (ref 150–400)
RBC: 4.32 MIL/uL (ref 4.22–5.81)
RDW: 13.7 % (ref 11.5–15.5)
WBC: 7.2 10*3/uL (ref 4.0–10.5)

## 2016-08-18 LAB — APTT: aPTT: 30 seconds (ref 24–36)

## 2016-08-18 LAB — PROTIME-INR
INR: 0.98
Prothrombin Time: 13 seconds (ref 11.4–15.2)

## 2016-08-18 MED ORDER — GELATIN ABSORBABLE 12-7 MM EX MISC
CUTANEOUS | Status: AC
  Filled 2016-08-18: qty 1

## 2016-08-18 MED ORDER — FENTANYL CITRATE (PF) 100 MCG/2ML IJ SOLN
INTRAMUSCULAR | Status: AC
  Filled 2016-08-18: qty 2

## 2016-08-18 MED ORDER — MIDAZOLAM HCL 2 MG/2ML IJ SOLN
INTRAMUSCULAR | Status: AC
  Filled 2016-08-18: qty 2

## 2016-08-18 MED ORDER — MIDAZOLAM HCL 2 MG/2ML IJ SOLN
INTRAMUSCULAR | Status: AC | PRN
  Administered 2016-08-18 (×2): 1 mg via INTRAVENOUS

## 2016-08-18 MED ORDER — LIDOCAINE HCL (PF) 1 % IJ SOLN
INTRAMUSCULAR | Status: AC
  Administered 2016-08-18: 10 mL
  Filled 2016-08-18: qty 30

## 2016-08-18 MED ORDER — SODIUM CHLORIDE 0.9 % IV SOLN: INTRAVENOUS | Status: DC

## 2016-08-18 MED ORDER — FENTANYL CITRATE (PF) 100 MCG/2ML IJ SOLN
INTRAMUSCULAR | Status: AC | PRN
  Administered 2016-08-18 (×2): 50 ug via INTRAVENOUS

## 2016-08-18 NOTE — Discharge Instructions (Addendum)
Needle Biopsy, Care After °These instructions give you information about caring for yourself after your procedure. Your doctor may also give you more specific instructions. Call your doctor if you have any problems or questions after your procedure. °Follow these instructions at home: °· Rest as told by your doctor. °· Take medicines only as told by your doctor. °· There are many different ways to close and cover the biopsy site, including stitches (sutures), skin glue, and adhesive strips. Follow instructions from your doctor about: °? How to take care of your biopsy site. °? When and how you should change your bandage (dressing). °? When you should remove your dressing. °? Removing whatever was used to close your biopsy site. °· Check your biopsy site every day for signs of infection. Watch for: °? Redness, swelling, or pain. °? Fluid, blood, or pus. °Contact a doctor if: °· You have a fever. °· You have redness, swelling, or pain at the biopsy site, and it lasts longer than a few days. °· You have fluid, blood, or pus coming from the biopsy site. °· You feel sick to your stomach (nauseous). °· You throw up (vomit). °Get help right away if: °· You are short of breath. °· You have trouble breathing. °· Your chest hurts. °· You feel dizzy or you pass out (faint). °· You have bleeding that does not stop with pressure or a bandage. °· You cough up blood. °· Your belly (abdomen) hurts. °This information is not intended to replace advice given to you by your health care provider. Make sure you discuss any questions you have with your health care provider. °Document Released: 01/30/2008 Document Revised: 07/25/2015 Document Reviewed: 02/12/2014 °Elsevier Interactive Patient Education © 2018 Elsevier Inc. °Moderate Conscious Sedation, Adult, Care After °These instructions provide you with information about caring for yourself after your procedure. Your health care provider may also give you more specific instructions.  Your treatment has been planned according to current medical practices, but problems sometimes occur. Call your health care provider if you have any problems or questions after your procedure. °What can I expect after the procedure? °After your procedure, it is common: °· To feel sleepy for several hours. °· To feel clumsy and have poor balance for several hours. °· To have poor judgment for several hours. °· To vomit if you eat too soon. ° °Follow these instructions at home: °For at least 24 hours after the procedure: ° °· Do not: °? Participate in activities where you could fall or become injured. °? Drive. °? Use heavy machinery. °? Drink alcohol. °? Take sleeping pills or medicines that cause drowsiness. °? Make important decisions or sign legal documents. °? Take care of children on your own. °· Rest. °Eating and drinking °· Follow the diet recommended by your health care provider. °· If you vomit: °? Drink water, juice, or soup when you can drink without vomiting. °? Make sure you have little or no nausea before eating solid foods. °General instructions °· Have a responsible adult stay with you until you are awake and alert. °· Take over-the-counter and prescription medicines only as told by your health care provider. °· If you smoke, do not smoke without supervision. °· Keep all follow-up visits as told by your health care provider. This is important. °Contact a health care provider if: °· You keep feeling nauseous or you keep vomiting. °· You feel light-headed. °· You develop a rash. °· You have a fever. °Get help right away if: °· You   have trouble breathing. This information is not intended to replace advice given to you by your health care provider. Make sure you discuss any questions you have with your health care provider. Document Released: 12/07/2012 Document Revised: 07/22/2015 Document Reviewed: 06/08/2015 Elsevier Interactive Patient Education  2018 [place] from Work, [employer], or Physical Activity Orlandus Gowell needs to be excused from: __X__ Work ____ [employer] ____ Physical activity beginning now and through the following date: 08/24/16  Health Care Provider Name (printed): Dr. Maryclare Bean [phone]  Date:08/18/16   This information is not intended to replace advice given to you by your health care provider. Make sure you discuss any questions you have with your health care provider. Document Released: 08/12/2000 Document Revised: 01/31/2016 Document Reviewed: 09/18/2013 Elsevier Interactive Patient Education  [employer].

## 2016-08-18 NOTE — Sedation Documentation (Signed)
Patient is resting comfortably. 

## 2016-08-18 NOTE — Procedures (Signed)
Left transgluteal pelvic mass Bx 18 g core times three EBL 0 Comp 0

## 2016-08-18 NOTE — H&P (Signed)
Chief Complaint: Patient was seen in consultation today for perirectal nodule biopsy at the request of 47 M  Referring Physician(s): 29 M  Supervising Physician: Marybelle Killings  Patient Status: [facility] - Out-pt  History of Present Illness: Charles [name] is a 56 y.o. male   Pt follows with Dr Hilarie Fredrickson for diverticulitis and hemorrhoids Complains of intermittent painless rectal bleeding Last colonoscopy 03/2016 Ct then revealed perirectal node Followed with repeat scan 07/31/16 IMPRESSION: 1. Interval progression of the left perirectal soft tissue nodule seen on the prior study. The lesion measures up to 3.4 cm today and appears to displace the rectum rather than arise from it. Although it is well-defined and relatively homogeneous, the interval progression is concerning for neoplasm. PET-CT may prove helpful to further evaluate. No other abnormal soft tissue lesions or lymphadenopathy identified in the pelvis. 2. Right colonic diverticulosis with relative sparing of the left colon. 3. Bilateral renal cysts.  Now scheduled for biopsy per Dr Hilarie Fredrickson  Past Medical History:  Diagnosis Date  . Blurred vision   . Diverticulosis   . Hemorrhoids   . Rectal bleeding   . Renal cyst     Past Surgical History:  Procedure Laterality Date  . CIRCUMCISION      Allergies: Patient has no known allergies.  Medications: Prior to Admission medications   Medication Sig Start Date End Date Taking? Authorizing Provider  Na Sulfate-K Sulfate-Mg Sulf 17.5-3.13-1.6 GM/180ML SOLN Suprep-Use as directed Patient not taking: Reported on 08/18/2016 06/16/16   Pyrtle, Lajuan Lines, MD     Family History  Problem Relation Age of Onset  . Colon cancer Neg Hx   . Esophageal cancer Neg Hx   . Rectal cancer Neg Hx   . Stomach cancer Neg Hx     Social History   Social History  . Marital status: Single    Spouse name: N/A  . Number of children: N/A  . Years of education: N/A    Social History Main Topics  . Smoking status: Never Smoker  . Smokeless tobacco: Never Used  . Alcohol use 0.6 oz/week    1 Cans of beer per week     Comment: socially  . Drug use: No  . Sexual activity: Not Asked   Other Topics Concern  . None   Social History Narrative  . None    Review of Systems: A 12 point ROS discussed and pertinent positives are indicated in the HPI above.  All other systems are negative.  Review of Systems  Constitutional: Negative for activity change, appetite change, fatigue and fever.  Respiratory: Negative for cough and shortness of breath.   Gastrointestinal: Positive for anal bleeding, blood in stool and rectal pain. Negative for abdominal pain.  Musculoskeletal: Negative for back pain.  Neurological: Negative for weakness.  Psychiatric/Behavioral: Negative for behavioral problems and confusion.    Vital Signs: BP (!) 141/94   Pulse 87   Temp 98.4 F (36.9 C)   Resp 18   Ht 5\' 6"  (1.676 m)   Wt 200 lb (90.7 kg)   SpO2 95%   BMI 32.28 kg/m   Physical Exam  Constitutional: He is oriented to person, place, and time.  Cardiovascular: Normal rate, regular rhythm and normal heart sounds.   Pulmonary/Chest: Effort normal and breath sounds normal.  Abdominal: Soft. Bowel sounds are normal.  Musculoskeletal: Normal range of motion.  Neurological: He is alert and oriented to person, place, and time.  Skin: Skin is warm and  dry.  Psychiatric: He has a normal mood and affect. His behavior is normal. Judgment and thought content normal.  Nursing note and vitals reviewed.   Mallampati Score:  MD Evaluation Airway: WNL Heart: WNL Abdomen: WNL Chest/ Lungs: WNL ASA  Classification: 2 Mallampati/Airway Score: One  Imaging: Ct Abdomen Pelvis W Contrast  Result Date: 07/31/2016 CLINICAL DATA:  Perirectal lymph nodes seen on prior study. EXAM: CT ABDOMEN AND PELVIS WITH CONTRAST TECHNIQUE: Multidetector CT imaging of the abdomen and pelvis  was performed using the standard protocol following bolus administration of intravenous contrast. CONTRAST:  135mL ISOVUE-300 IOPAMIDOL (ISOVUE-300) INJECTION 61% COMPARISON:  03/20/2015 FINDINGS: Lower chest:  Unremarkable. Hepatobiliary: Stable appearance 11 mm cyst lateral segment left liver. Liver parenchyma otherwise unremarkable. There is no evidence for gallstones, gallbladder wall thickening, or pericholecystic fluid. No intrahepatic or extrahepatic biliary dilation. Pancreas: No focal mass lesion. No dilatation of the main duct. No intraparenchymal cyst. No peripancreatic edema. Spleen: No splenomegaly. No focal mass lesion. Adrenals/Urinary Tract: No adrenal nodule or mass. Multiple bilateral renal cysts are again identified, measuring up to 4.7 cm lesion interpolar left kidney some of the low-density lesions in each kidney are too small to characterize. No overtly suspicious lesion evident in either kidney. No evidence for hydroureter. The urinary bladder appears normal for the degree of distention. Stomach/Bowel: Stomach is nondistended. No gastric wall thickening. No evidence of outlet obstruction. Duodenum is normally positioned as is the ligament of Treitz. No small bowel wall thickening. No small bowel dilatation. The terminal ileum is normal. The appendix is normal. Diverticuli are seen scattered along the right colon with relative sparing of the left colon. The 14 mm soft tissue nodule to the left of the rectum has progressed substantially in the interval. This lesion measures 3.2 x 3.4 x 2.9 cm on today's study. It is relatively homogeneous and has well-defined margins. The mass appears to displace the distal rectum rather than arise from it. Vascular/Lymphatic: No abdominal aortic aneurysm. No abdominal aortic atherosclerotic calcification. There is no gastrohepatic or hepatoduodenal ligament lymphadenopathy. No intraperitoneal or retroperitoneal lymphadenopathy. No pelvic sidewall  lymphadenopathy. See paragraph above for perirectal lymph node identified on prior study. Reproductive: Prostate gland mildly enlarged. Other: No intraperitoneal free fluid. Musculoskeletal: Bone windows reveal no worrisome lytic or sclerotic osseous lesions. IMPRESSION: 1. Interval progression of the left perirectal soft tissue nodule seen on the prior study. The lesion measures up to 3.4 cm today and appears to displace the rectum rather than arise from it. Although it is well-defined and relatively homogeneous, the interval progression is concerning for neoplasm. PET-CT may prove helpful to further evaluate. No other abnormal soft tissue lesions or lymphadenopathy identified in the pelvis. 2. Right colonic diverticulosis with relative sparing of the left colon. 3. Bilateral renal cysts. These results will be called to the ordering clinician or representative by the Radiologist Assistant, and communication documented in the PACS or zVision Dashboard. Electronically Signed   By: Misty Stanley M.D.   On: 07/31/2016 16:51    Labs:  CBC:  Recent Labs  08/18/16 0940  WBC 7.2  HGB 14.0  HCT 40.3  PLT 324    COAGS:  Recent Labs  08/18/16 0940  INR 0.98  APTT 30    BMP: No results for input(s): NA, K, CL, CO2, GLUCOSE, BUN, CALCIUM, CREATININE, GFRNONAA, GFRAA in the last 8760 hours.  Invalid input(s): CMP  LIVER FUNCTION TESTS: No results for input(s): BILITOT, AST, ALT, ALKPHOS, PROT, ALBUMIN in the last  8760 hours.  TUMOR MARKERS: No results for input(s): AFPTM, CEA, CA199, CHROMGRNA in the last 8760 hours.  Assessment and Plan:  Known diverticulitis Intermittent rectal bleeding Follows with Dr Hilarie Fredrickson Enlarged perirectal node--- for biopsy Risks and Benefits discussed with the patient including, but not limited to bleeding, infection, damage to adjacent structures or low yield requiring additional tests. All of the patient's questions were answered, patient is agreeable to  proceed. Consent signed and in chart.   Thank you for this interesting consult.  I greatly enjoyed meeting Charles [name] and look forward to participating in their care.  A copy of this report was sent to the requesting provider on this date.  Electronically Signed: Lavonia Drafts, PA-C 08/18/2016, 10:58 AM   I spent a total of  30 Minutes   in face to face in clinical consultation, greater than 50% of which was counseling/coordinating care for perirectal node biopsy

## 2016-08-19 ENCOUNTER — Ambulatory Visit: Payer: Self-pay | Admitting: Emergency Medicine

## 2016-08-21 ENCOUNTER — Telehealth: Payer: Self-pay | Admitting: *Deleted

## 2016-08-21 NOTE — Telephone Encounter (Signed)
Dr Hilarie Fredrickson has contacted patient to advise of pathology result of prostate adenocarcinoma. Dr Hilarie Fredrickson as asked that we make urgent referral to [facility] for this. I have spoken to triage nurse, Danae Chen at [facility] and she has requested that recrods be sent to them for the doctor's review at [phone]. Records have been sent and I have confirmed that these records have been received. The physician has been made aware of request for urgent referral and nurse states that he should be calling her back to give her okay to schedule appointment.

## 2016-08-24 ENCOUNTER — Encounter: Payer: Commercial Managed Care - HMO | Admitting: Internal Medicine

## 2016-08-24 NOTE — Telephone Encounter (Signed)
Charles [name] states that they attempted to schedule patient on Friday for an appointment and patient stated that he needed to speak with his supervisor about it before scheduling an appointment. When I called patient today, patient stated that he indeed did state this. He also states that he called earlier today to schedule but was told that they had no information on him. I again called Charles [name] and Charles [name] stated that she would reach back out to the patient. I contacted [facility] moments ago and the patient still has not made an appointment. I also contacted the patient again to make sure [facility] had reached out to him. Patient did not answer. I left a voicemail for him to call back.

## 2016-08-24 NOTE — Telephone Encounter (Signed)
I have again contacted [facility] and have spoken with Merry Lofty, RN who states she will be speaking with Dr Alinda Money today about patient and getting him in stat for newly diagnosed prostate cancer.

## 2016-08-25 ENCOUNTER — Telehealth: Payer: Self-pay | Admitting: Internal Medicine

## 2016-08-25 NOTE — Telephone Encounter (Signed)
Sharyn Lull at [facility] has contacted me this morning. She tells me that she has spoken to the patient this morning and has advised him that he is scheduled to see their office this Thursday, August 27, 2016 @ 8:15 am. She states that patient verbalized understanding and told her again that he would tell his supervisor and try to be there for appointment.

## 2016-08-27 ENCOUNTER — Other Ambulatory Visit: Payer: Self-pay | Admitting: Urology

## 2016-08-27 DIAGNOSIS — C61 Malignant neoplasm of prostate: Secondary | ICD-10-CM

## 2016-08-28 ENCOUNTER — Encounter: Payer: Self-pay | Admitting: Radiation Oncology

## 2016-09-01 ENCOUNTER — Ambulatory Visit: Payer: Self-pay | Admitting: Surgery

## 2016-09-03 ENCOUNTER — Encounter (HOSPITAL_COMMUNITY): Payer: Commercial Managed Care - HMO

## 2016-09-03 ENCOUNTER — Encounter: Payer: Self-pay | Admitting: Oncology

## 2016-09-03 ENCOUNTER — Telehealth: Payer: Self-pay | Admitting: Oncology

## 2016-09-03 ENCOUNTER — Encounter: Payer: Self-pay | Admitting: Radiation Oncology

## 2016-09-03 ENCOUNTER — Ambulatory Visit (HOSPITAL_COMMUNITY)
Admission: RE | Admit: 2016-09-03 | Discharge: 2016-09-03 | Disposition: A | Discharge status: Home / Self Care | Payer: Commercial Managed Care - HMO | Source: Ambulatory Visit | Attending: Urology | Admitting: Urology

## 2016-09-03 DIAGNOSIS — R93421 Abnormal radiologic findings on diagnostic imaging of right kidney: Secondary | ICD-10-CM | POA: Diagnosis not present

## 2016-09-03 DIAGNOSIS — R937 Abnormal findings on diagnostic imaging of other parts of musculoskeletal system: Secondary | ICD-10-CM | POA: Insufficient documentation

## 2016-09-03 DIAGNOSIS — M19072 Primary osteoarthritis, left ankle and foot: Secondary | ICD-10-CM | POA: Diagnosis not present

## 2016-09-03 DIAGNOSIS — C61 Malignant neoplasm of prostate: Secondary | ICD-10-CM | POA: Diagnosis present

## 2016-09-03 MED ORDER — TECHNETIUM TC 99M MEDRONATE IV KIT
21.5000 | PACK | Freq: Once | INTRAVENOUS | Status: AC | PRN
  Administered 2016-09-03: 21.5 via INTRAVENOUS

## 2016-09-03 NOTE — Telephone Encounter (Signed)
Appt has been scheduled for the pt to see Dr. Alen Blew on 8/1 at 11am. Pt agreed to the appt date and time. Demographics verified. Pt aware to arrive 30 minutes early. Letter mailed and faxed to the referring

## 2016-09-11 ENCOUNTER — Encounter: Payer: Self-pay | Admitting: Radiation Oncology

## 2016-09-11 NOTE — Progress Notes (Signed)
GU Location of Tumor / Histology: prostatic adenocarcinoma   Initially seen for colonoscopy on 04/11/15 to evaluate rectal bleeding and an abnormal CT scan of the abdomen showing perirectal lymph node. Patient was lost to follow up. Patient returned on 06/16/2016 to Dr. Hilarie Fredrickson with fectal smearing and leakage when straining while working.    Past/Anticipated interventions by urology, if any: Bone scan (negative), Firmagon 240 mg on 08/28/16, referral to Dr. Alen Blew and Dr. Tammi Klippel  Past/Anticipated interventions by medical oncology, if any:   Weight changes, if any: no  Bowel/Bladder complaints, if any: IPSS 9. Denies dysuria, hematuria, leakage or incontinence. Reports blood in stool and pain with bowel movements have resolved.    Nausea/Vomiting, if any: no  Pain issues, if any:  no  SAFETY ISSUES:  Prior radiation? no  Pacemaker/ICD? no  Possible current pregnancy? no  Is the patient on methotrexate?   Current Complaints / other details:  56 year old male. Single. 2 daughters and 2 sons. Scheduled for consult with Shadad on 8/1. Denies taking any medication. Denies a family history of cancer on his mother's side of the family. Reports paternal history is unknown.

## 2016-09-14 ENCOUNTER — Ambulatory Visit
Admission: RE | Admit: 2016-09-14 | Discharge: 2016-09-14 | Disposition: A | Discharge status: Home / Self Care | Payer: Commercial Managed Care - HMO | Source: Ambulatory Visit | Attending: Radiation Oncology | Admitting: Radiation Oncology

## 2016-09-14 ENCOUNTER — Telehealth: Payer: Self-pay | Admitting: Radiation Oncology

## 2016-09-14 ENCOUNTER — Ambulatory Visit: Payer: Commercial Managed Care - HMO

## 2016-09-14 DIAGNOSIS — K629 Disease of anus and rectum, unspecified: Secondary | ICD-10-CM | POA: Insufficient documentation

## 2016-09-14 DIAGNOSIS — C61 Malignant neoplasm of prostate: Secondary | ICD-10-CM | POA: Insufficient documentation

## 2016-09-14 DIAGNOSIS — C775 Secondary and unspecified malignant neoplasm of intrapelvic lymph nodes: Secondary | ICD-10-CM | POA: Insufficient documentation

## 2016-09-14 DIAGNOSIS — K921 Melena: Secondary | ICD-10-CM | POA: Insufficient documentation

## 2016-09-14 HISTORY — DX: Malignant neoplasm of prostate: C61

## 2016-09-14 NOTE — Telephone Encounter (Signed)
Patient has not shown for 0830 appointment. Confirmed patient isn't in the building. Phoned patient. He reports he was unaware of today's appointment. Transferred patient to [facility] to reschedule. Informed Dr. Tammi Klippel of this finding.

## 2016-09-17 ENCOUNTER — Ambulatory Visit
Admission: RE | Admit: 2016-09-17 | Discharge: 2016-09-17 | Disposition: A | Discharge status: Home / Self Care | Payer: Commercial Managed Care - HMO | Source: Ambulatory Visit | Attending: Radiation Oncology | Admitting: Radiation Oncology

## 2016-09-17 ENCOUNTER — Encounter: Payer: Self-pay | Admitting: Radiation Oncology

## 2016-09-17 VITALS — BP 131/82 | HR 85 | Resp 16 | Ht 66.0 in | Wt 204.2 lb

## 2016-09-17 DIAGNOSIS — C61 Malignant neoplasm of prostate: Secondary | ICD-10-CM | POA: Diagnosis present

## 2016-09-17 DIAGNOSIS — K921 Melena: Secondary | ICD-10-CM | POA: Diagnosis not present

## 2016-09-17 DIAGNOSIS — K629 Disease of anus and rectum, unspecified: Secondary | ICD-10-CM | POA: Diagnosis not present

## 2016-09-17 DIAGNOSIS — C775 Secondary and unspecified malignant neoplasm of intrapelvic lymph nodes: Secondary | ICD-10-CM | POA: Diagnosis not present

## 2016-09-17 HISTORY — DX: Chronic kidney disease, unspecified: N18.9

## 2016-09-17 NOTE — Progress Notes (Signed)
Radiation Oncology         [phone]) [phone] ________________________________  Initial outpatient Consultation  Name: Charles [name] MRN: [id number]  Date: 09/17/2016  DOB: Apr 04, 1960  PJ:ASNKNLZ, No Pcp Per  Alyson Ingles Candee Furbish, MD   REFERRING PHYSICIAN: Alyson Ingles Candee Furbish, MD  DIAGNOSIS: 56 y.o. 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 gentleman who was recently diagnosed with an adenocarcinoma of the prostate on a biopsy of a soft tissue mass in the perirectal space within the left pelvis. The patient presented for evaluation due to rectal bleeding in 2017 and was sent for CT abdomen and pelvis which revealed a 1.4 cm enlarged node adjacent to the left of the rectum. He was seen by Dr. Hilarie Fredrickson in GI and underwent colonoscopy however his prep was not ideal. He was encouraged to proceed with a PET scan and repeat colonoscopy to delineate the findings seen on scan, and was lost to follow-up. He read presented to Dr. Hilarie Fredrickson in April 2018 complaining of fecal smearing with intermittent rectal bleeding and feeling a palpable knot after wiping following bowel movements. He was counseled on the role for repeat scan which was performed on 07/31/16, and this revealed a 3.2 x 3.4 x 2.9 cm homogeneous mass with well-defined margins. It was noted to displace the distal rectum. His prostate was felt to be enlarged and compressing the bladder.  On 08/18/16 he underwent a needle biopsy of this mass revealing adenocarcinoma consistent with prostatic primary. He has met with Dr. Alyson Ingles and his PSA on 08/27/2016 was 698.  DRE revealed rubbery firmness bilaterally. He started [place] on 08/28/16. He will meet with Dr. Alen Blew on 09/30/16, and comes today to discuss possible use of radiotherapy in the management of his disease.    REVIEW OF SYSTEMS: On review of systems, the  patient reports that he is doing well overall. He denies any chest pain, shortness of breath, cough, fevers, chills, night sweats, unintended weight changes.   His IPSS score was 9 indicating moderate urinary outflow obstructive symptoms.  He indicated that his erectile function is intact to complete sexual activity with all attempts.  He denies any bowel disturbances, and denies abdominal pain, nausea or vomiting. He has been having episode of rectal bleeding and hematochezia. He has had not had any episode in the past weeks but has symptoms ongoing for more than year. He denies any new musculoskeletal or joint aches or pains, new skin lesions or concerns. A complete review of systems is obtained and is otherwise negative.  PREVIOUS RADIATION THERAPY: No Past Medical History:  Past Medical History:  Diagnosis Date  . Blurred vision   . Diverticulosis   . Hemorrhoids   . Prostate cancer ([place])   . Rectal bleeding   . Renal cyst     Past Surgical History: Past Surgical History:  Procedure Laterality Date  . CIRCUMCISION    . needle core biopsy      Social History:  Social History   Social History  . Marital status: Single    Spouse name: N/A  . Number of children: N/A  . Years of education: N/A   Occupational History  . Not on file.   Social History Main Topics  . Smoking status: Never Smoker  . Smokeless tobacco: Never Used  . Alcohol use 0.6 oz/week    1 Cans  of beer per week     Comment: socially  . Drug use: No  . Sexual activity: Not on file   Other Topics Concern  . Not on file   Social History Narrative  . No narrative on file   He lives in [place], [place]. Has four children, is single and works for the city of [employer]. Family History: Family History  Problem Relation Age of Onset  . Colon cancer Neg Hx   . Esophageal cancer Neg Hx   . Rectal cancer Neg Hx   . Stomach cancer Neg Hx     PHYSICAL EXAM:  Wt Readings from Last 3 Encounters:  08/18/16 200  lb (90.7 kg)  06/16/16 202 lb (91.6 kg)  04/11/15 207 lb (93.9 kg)   Temp Readings from Last 3 Encounters:  08/18/16 98.4 F (36.9 C)  04/11/15 97 F (36.1 C)  03/20/15 98.1 F (36.7 C) (Oral)   BP Readings from Last 3 Encounters:  08/18/16 132/90  06/16/16 126/72  04/11/15 (!) 149/119   Pulse Readings from Last 3 Encounters:  08/18/16 73  06/16/16 80  04/11/15 67   In general this is a well appearing he in no acute distress. He is alert and oriented x4 and appropriate throughout the examination. HEENT reveals that the patient is normocephalic, atraumatic. EOMs are intact. PERRLA. Skin is intact without any evidence of gross lesions. Cardiovascular exam reveals a regular rate and rhythm, no clicks rubs or murmurs are auscultated. Chest is clear to auscultation bilaterally. Lymphatic assessment is performed and does not reveal any adenopathy in the cervical, supraclavicular, axillary, or inguinal chains. Abdomen has active bowel sounds in all quadrants and is intact. The abdomen is soft, non tender, non distended. Lower extremities are negative for pretibial pitting edema, deep calf tenderness, cyanosis or clubbing.    KPS = 90  100 - Normal; no complaints; no evidence of disease. 90   - Able to carry on normal activity; minor signs or symptoms of disease. 80   - Normal activity with effort; some signs or symptoms of disease. 68   - Cares for self; unable to carry on normal activity or to do active work. 60   - Requires occasional assistance, but is able to care for most of his personal needs. 50   - Requires considerable assistance and frequent medical care. 41   - Disabled; requires special care and assistance. 64   - Severely disabled; hospital admission is indicated although death not imminent. 37   - Very sick; hospital admission necessary; active supportive treatment necessary. 10   - Moribund; fatal processes progressing rapidly. 0     - Dead  Karnofsky DA, Abelmann Baca,  Craver LS and Burchenal [facility] [phone]) The use of the nitrogen mustards in the palliative treatment of carcinoma: with particular reference to bronchogenic carcinoma Cancer 1 634-56   LABORATORY DATA:  Lab Results  Component Value Date   WBC 7.2 08/18/2016   HGB 14.0 08/18/2016   HCT 40.3 08/18/2016   MCV 93.3 08/18/2016   PLT 324 08/18/2016   Lab Results  Component Value Date   NA 142 03/20/2015   K 3.8 03/20/2015   CL 107 03/20/2015   CO2 25 03/20/2015   Lab Results  Component Value Date   ALT 19 03/20/2015   AST 20 03/20/2015   ALKPHOS 59 03/20/2015   BILITOT 0.2 (L) 03/20/2015     RADIOGRAPHY: Nm Bone Scan Whole Body  Result Date: 09/03/2016 CLINICAL DATA:  History of prostate cancer.  Elevated PSA. EXAM: NUCLEAR MEDICINE WHOLE BODY BONE SCAN TECHNIQUE: Whole body anterior and posterior images were obtained approximately 3 hours after intravenous injection of radiopharmaceutical. RADIOPHARMACEUTICALS:  21.5 mCi Technetium-26mMDP IV COMPARISON:  CT scan July 31, 2016 FINDINGS: Uptake along the right side of the thoracic spine is likely degenerative. Degenerative changes are seen in the left great toe. Uptake at the left first costochondral junction is most likely degenerative as well. No evidence of bony metastatic disease is identified. The right renal collecting system is mildly prominent compared to the left. IMPRESSION: 1. No bony metastatic disease identified. Scattered degenerative changes as above. 2. Mild prominence of the right renal collecting system of uncertain etiology. Cross-sectional imaging such as ultrasound or CT imaging could better evaluate the right kidney and associated collecting system. Electronically Signed   By: DDorise BullionIII M.D   On: 09/03/2016 14:05   Ct Biopsy  Result Date: 08/18/2016 INDICATION: Left perirectal mass EXAM: CT BIOPSY MEDICATIONS: None. ANESTHESIA/SEDATION: Fentanyl 100 mcg IV; Versed 2 mg IV Moderate Sedation Time:  13 The patient was  continuously monitored during the procedure by the interventional radiology nurse under my direct supervision. FLUOROSCOPY TIME:  None COMPLICATIONS: None immediate. PROCEDURE: Informed written consent was obtained from the patient after a thorough discussion of the procedural risks, benefits and alternatives. All questions were addressed. Maximal Sterile Barrier Technique was utilized including caps, mask, sterile gowns, sterile gloves, sterile drape, hand hygiene and skin antiseptic. A timeout was performed prior to the initiation of the procedure. Under CT guidance, a(n) 17 gauge guide needle was advanced into the left perirectal mass. Subsequently 3 18 gauge core biopsies were obtained. Gel-Foam slurry was injected into the guide needle. The guide needle was removed. Post biopsy images demonstrate no hemorrhage. Patient tolerated the procedure well without complication. Vital sign monitoring by nursing staff during the procedure will continue as patient is in the special procedures unit for post procedure observation. FINDINGS: The images document guide needle placement within the left perirectal mass. Post biopsy images demonstrate no hemorrhage. IMPRESSION: Successful CT-guided core biopsy of a left perirectal mass. Electronically Signed   By: AMarybelle KillingsM.D.   On: 08/18/2016 12:30      IMPRESSION/PLAN: 1.  This patient is a 56y.o. 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  He has metastatic prostate cancer to a perirectal lymph node in the left pelvis and a PSA of 698. We met with the patient today to discuss the findings from his previous biopsy and imaging studies. We discussed radiotherapy treatment locally in the pelvis for local control and palliation.  We discussed the risks, benefits, short, and long term effects of radiotherapy, and the delivery and logistics of radiotherapy. At this point, the patient is  planning to meet with medical oncology to discuss options of treatment, and we will convene once Dr. SAlen Blewhas met with the patient. We will hold off on palliative treatment for now, but could consider radiotherapy for local control.  2. Rectal discharge. It is hoped that FMills Kollerwill induce some shrinkage of the perirectal node and prostate potentially reducing rectal symptoms.  The patient is counseled as above on the role of palliative radiotherapy and we can consider this once he's had discussion with Dr. SAlen Blew      ACarola Rhine PAC and  MSheral ApleyMTammi Klippel M.D.  This document serves as a record of services personally performed by Tyler Pita MD. It was created on his behalf by Delton Coombes, a trained medical scribe. The creation of this record is based on the scribe's personal observations and the provider's statements to them. This document has been checked and approved by the attending provider.

## 2016-09-17 NOTE — Progress Notes (Signed)
See progress note under physician encounter. 

## 2016-09-30 ENCOUNTER — Ambulatory Visit: Payer: Commercial Managed Care - HMO | Admitting: Oncology

## 2016-10-15 ENCOUNTER — Telehealth: Payer: Self-pay | Admitting: Oncology

## 2016-10-15 NOTE — Telephone Encounter (Signed)
Cld the pt and lft a vm to reschedule appt with Dr. Alen Blew

## 2016-11-09 ENCOUNTER — Telehealth: Payer: Self-pay | Admitting: Oncology

## 2016-11-09 NOTE — Telephone Encounter (Signed)
Appt has been scheduled for the pt to see Dr. Alen Blew on 9/13 at 2pm. Bethena Roys from [facility] will notify the pt.

## 2016-11-12 ENCOUNTER — Ambulatory Visit (HOSPITAL_BASED_OUTPATIENT_CLINIC_OR_DEPARTMENT_OTHER): Payer: 59 | Admitting: Oncology

## 2016-11-12 ENCOUNTER — Telehealth: Payer: Self-pay | Admitting: Oncology

## 2016-11-12 ENCOUNTER — Encounter: Payer: Self-pay | Admitting: Medical Oncology

## 2016-11-12 VITALS — BP 137/81 | HR 86 | Temp 98.5°F | Resp 20 | Ht 66.0 in | Wt 204.1 lb

## 2016-11-12 DIAGNOSIS — C61 Malignant neoplasm of prostate: Secondary | ICD-10-CM

## 2016-11-12 DIAGNOSIS — E291 Testicular hypofunction: Secondary | ICD-10-CM

## 2016-11-12 MED ORDER — ABIRATERONE ACETATE 250 MG PO TABS: 1000.0000 mg | ORAL_TABLET | Freq: Every day | ORAL | 0 refills | Status: DC

## 2016-11-12 MED ORDER — PREDNISONE 5 MG PO TABS: 5.0000 mg | ORAL_TABLET | Freq: Every day | ORAL | 3 refills | Status: DC

## 2016-11-12 NOTE — Progress Notes (Signed)
Introduced myself to Charles [name] as the prostate nurse navigator and my role. He saw Dr. Tammi Klippel in July but missed his first scheduled appointment with Dr. Alen Blew  09/30/16. He received [place] in June and states he has hot flashes. Dr. Alen Blew has prescribed Zytiga with prednisone. He was given printed information on Zytiga. I discussed how our chemo pharmacist will investigate insurance coverage and will be in contact with him. He voiced understanding. I will continue to follow and asked him to call with questions and or concerns.

## 2016-11-12 NOTE — Telephone Encounter (Signed)
Gave avs and calendar for october °

## 2016-11-12 NOTE — Progress Notes (Signed)
Reason for Referral: Prostate cancer.   HPI: 56 year old currently of [place] where he lived the majority of his life. He has no significant comorbid conditions and currently works for the city of [place] in [employer]. He started developing rectal discomfort and nodule that felt like hemorrhoids in 2017. He also started developing rectal bleeding intermittently. At that time CT scan of the abdomen and pelvis revealed a 1.4 cm nodule adjacent to the rectum. GI workup at that time including colonoscopy did not reveal any abnormalities. He subsequently started developing worsening rectal bleeding and fecal smearing. Repeat CT scan on 07/31/2016 showed a 3.2 x 3.4 breath 2.9 cm mass. On 08/18/16 he underwent a needle biopsy of this mass revealing adenocarcinoma consistent with prostatic primary. He was seen by Dr. Alyson Ingles and his PSA on 08/27/2016 was 698.  DRE revealed rubbery firmness bilaterally. He started [place] on 08/28/16. His PSA decreased subsequently to around 100 after starting androgen deprivation therapy. He was also evaluated by Dr. Tammi Klippel for potential radiation therapy which considered to be an option. He tolerated androgen deprivation therapy reasonably well although he is complaining of hot flashes. He is currently taking Megace which did not help his symptoms at this time. He does report frequent urination and nocturia but no hematuria or dysuria. He continues to have occasional smearing and discomfort in the rectal area. He is no longer reporting any hematochezia. He is otherwise asymptomatic without any weight loss, constitutional symptoms or excessive fatigue. His bone scan on 09/03/2016 showed no bony metastasis.  He does not report any headaches, blurry vision, syncope or seizures. He does not report any fevers, chills, sweats or weight loss. He does not report any chest pain, palpitation, orthopnea or leg edema. He does not report any cough, wheezing, hemoptysis or  dyspnea exertion. He does not report any nausea, vomiting, abdominal pain, constipation or diarrhea. He does not report any hematuria or dysuria. He does not report any skeletal complaints. He denied any arthralgias or myalgias. Remaining review of systems unremarkable.   Past Medical History:  Diagnosis Date  . Blurred vision   . Chronic kidney disease    renal cyst  . Diverticulosis   . Hemorrhoids   . Prostate cancer ([place])   . Rectal bleeding   :  Past Surgical History:  Procedure Laterality Date  . CIRCUMCISION    . needle core biopsy    :   Current Outpatient Prescriptions:  .  megestrol (MEGACE) 20 MG tablet, Take 1 tablet by mouth 2 (two) times daily., Disp: , Rfl: 3 .  abiraterone Acetate (ZYTIGA) 250 MG tablet, Take 4 tablets (1,000 mg total) by mouth daily. Take on an empty stomach 1 hour before or 2 hours after a meal, Disp: 120 tablet, Rfl: 0 .  predniSONE (DELTASONE) 5 MG tablet, Take 1 tablet (5 mg total) by mouth daily with breakfast., Disp: 30 tablet, Rfl: 3:  No Known Allergies:  Family History  Problem Relation Age of Onset  . Colon cancer Neg Hx   . Esophageal cancer Neg Hx   . Rectal cancer Neg Hx   . Stomach cancer Neg Hx   . Cancer Neg Hx   :  Social History   Social History  . Marital status: Single    Spouse name: N/A  . Number of children: N/A  . Years of education: N/A   Occupational History  . Not on file.   Social History Main Topics  . Smoking status: Never Smoker  .  Smokeless tobacco: Never Used  . Alcohol use 0.6 oz/week    1 Cans of beer per week     Comment: socially  . Drug use: No  . Sexual activity: Yes   Other Topics Concern  . Not on file   Social History Narrative  . No narrative on file  :  Pertinent items are noted in HPI.  Exam: Blood pressure 137/81, pulse 86, temperature 98.5 F (36.9 C), temperature source Oral, resp. rate 20, height 5\' 6"  (1.676 m), weight 204 lb 1.6 oz (92.6 kg), SpO2 100 %.  ECOG 0   General appearance: alert and cooperative appeared without distress. Throat: Without oral thrush or ulcers. Neck: no adenopathy or masses. Resp: clear to auscultation bilaterally without rhonchi, wheezes of those to percussion. Chest wall: no tenderness Cardio: regular rate and rhythm, S1, S2 normal, no murmur, click, rub or gallop GI: soft, non-tender; bowel sounds normal; no masses,  no organomegaly Extremities: extremities normal, atraumatic, no cyanosis or edema Pulses: 2+ and symmetric Skin: Skin color, texture, turgor normal. No rashes or lesions Lymph nodes: Cervical, supraclavicular, and axillary nodes normal.  CBC    Component Value Date/Time   WBC 7.2 08/18/2016 0940   RBC 4.32 08/18/2016 0940   HGB 14.0 08/18/2016 0940   HCT 40.3 08/18/2016 0940   PLT 324 08/18/2016 0940   MCV 93.3 08/18/2016 0940   MCH 32.4 08/18/2016 0940   MCHC 34.7 08/18/2016 0940   RDW 13.7 08/18/2016 0940     Chemistry      Component Value Date/Time   NA 142 03/20/2015 1500   K 3.8 03/20/2015 1500   CL 107 03/20/2015 1500   CO2 25 03/20/2015 1500   BUN 14 03/20/2015 1500   CREATININE 1.00 03/20/2015 1500      Component Value Date/Time   CALCIUM 9.2 03/20/2015 1500   ALKPHOS 59 03/20/2015 1500   AST 20 03/20/2015 1500   ALT 19 03/20/2015 1500   BILITOT 0.2 (L) 03/20/2015 1500     EXAM: CT ABDOMEN AND PELVIS WITH CONTRAST  TECHNIQUE: Multidetector CT imaging of the abdomen and pelvis was performed using the standard protocol following bolus administration of intravenous contrast.  CONTRAST:  155mL ISOVUE-300 IOPAMIDOL (ISOVUE-300) INJECTION 61%  COMPARISON:  03/20/2015  FINDINGS: Lower chest:  Unremarkable.  Hepatobiliary: Stable appearance 11 mm cyst lateral segment left liver. Liver parenchyma otherwise unremarkable. There is no evidence for gallstones, gallbladder wall thickening, or pericholecystic fluid. No intrahepatic or extrahepatic biliary  dilation.  Pancreas: No focal mass lesion. No dilatation of the main duct. No intraparenchymal cyst. No peripancreatic edema.  Spleen: No splenomegaly. No focal mass lesion.  Adrenals/Urinary Tract: No adrenal nodule or mass. Multiple bilateral renal cysts are again identified, measuring up to 4.7 cm lesion interpolar left kidney some of the low-density lesions in each kidney are too small to characterize. No overtly suspicious lesion evident in either kidney. No evidence for hydroureter. The urinary bladder appears normal for the degree of distention.  Stomach/Bowel: Stomach is nondistended. No gastric wall thickening. No evidence of outlet obstruction. Duodenum is normally positioned as is the ligament of Treitz. No small bowel wall thickening. No small bowel dilatation. The terminal ileum is normal. The appendix is normal. Diverticuli are seen scattered along the right colon with relative sparing of the left colon. The 14 mm soft tissue nodule to the left of the rectum has progressed substantially in the interval. This lesion measures 3.2 x 3.4 x 2.9 cm on today's  study. It is relatively homogeneous and has well-defined margins. The mass appears to displace the distal rectum rather than arise from it.  Vascular/Lymphatic: No abdominal aortic aneurysm. No abdominal aortic atherosclerotic calcification. There is no gastrohepatic or hepatoduodenal ligament lymphadenopathy. No intraperitoneal or retroperitoneal lymphadenopathy. No pelvic sidewall lymphadenopathy.  See paragraph above for perirectal lymph node identified on prior study.  Reproductive: Prostate gland mildly enlarged.  Other: No intraperitoneal free fluid.  Musculoskeletal: Bone windows reveal no worrisome lytic or sclerotic osseous lesions.  IMPRESSION: 1. Interval progression of the left perirectal soft tissue nodule seen on the prior study. The lesion measures up to 3.4 cm today and appears to  displace the rectum rather than arise from it. Although it is well-defined and relatively homogeneous, the interval progression is concerning for neoplasm. PET-CT may prove helpful to further evaluate. No other abnormal soft tissue lesions or lymphadenopathy identified in the pelvis. 2. Right colonic diverticulosis with relative sparing of the left colon. 3. Bilateral renal cysts.   NUCLEAR MEDICINE WHOLE BODY BONE SCAN  TECHNIQUE: Whole body anterior and posterior images were obtained approximately 3 hours after intravenous injection of radiopharmaceutical.  RADIOPHARMACEUTICALS:  21.5 mCi Technetium-64m MDP IV  COMPARISON:  CT scan July 31, 2016  FINDINGS: Uptake along the right side of the thoracic spine is likely degenerative. Degenerative changes are seen in the left great toe. Uptake at the left first costochondral junction is most likely degenerative as well. No evidence of bony metastatic disease is identified. The right renal collecting system is mildly prominent compared to the left.  IMPRESSION: 1. No bony metastatic disease identified. Scattered degenerative changes as above. 2. Mild prominence of the right renal collecting system of uncertain etiology. Cross-sectional imaging such as ultrasound or CT imaging could better evaluate the right kidney and associated collecting system. ocumented in the PACS or zVision Dashboard    Assessment and Plan:   56 year old with the following issues:  1. Advanced prostate cancer diagnosed in June 2018. He presented with a perirectal mass measuring 3.2 x 3.4 x 2.9 cm to the left of the rectum. This was biopsy proven to be prostate cancer on 08/18/2016. His PSA at the time was 698. Bone scan did not show evidence of bony disease or metastasis. He was treated with [place] on and subsequently on Lupron every 3 months under the care of Dr. Alyson Ingles.  The natural course of this disease was discussed today with the patient.  Treatment options were also reviewed. He appears to have developed locally advanced disease with possible systemic metastasis based on his PSA. I agree with the systemic treatment utilizing added deprivation therapy as he is receiving at this time. He will continue Lupron every 3 months.  The rationale for using Zytiga as an additional therapy was discussed today. Risks and benefits and complications associated with this medication were reviewed. These complications include nausea, fatigue, edema, adrenal insufficiency, hypertension, hypokalemia, and others were reviewed. The benefit would be improvement and disease control and overall survival. Given his high-risk features he would benefit from this medication. After discussion today, he is agreeable to proceed with total of 1000 mg daily to be taking on an empty stomach with 5 mg of prednisone. Written information about both drugs were given to the patient today.  2. Local disease control: He is complaining of local symptoms predominantly pelvic discomfort and fecal smearing. The rationale for using radiation therapy for local control was discussed today. I am in favor of treating his disease with  radiation for local control and symptom control. He has excellent health and performance status and would be reasonable to use aggressive therapy.  3. Androgen depravation: He'll continue Lupron every 3 months under the care of Dr. Alyson Ingles indefinitely.  4. Follow-up: Will be in the next 4-5 weeks to follow his progress.

## 2016-11-16 ENCOUNTER — Telehealth: Payer: Self-pay | Admitting: Pharmacist

## 2016-11-16 DIAGNOSIS — C61 Malignant neoplasm of prostate: Secondary | ICD-10-CM

## 2016-11-16 MED ORDER — ABIRATERONE ACETATE 250 MG PO TABS: 1000.0000 mg | ORAL_TABLET | Freq: Every day | ORAL | 0 refills | Status: DC

## 2016-11-16 NOTE — Telephone Encounter (Signed)
Oral Oncology Pharmacist Encounter  Received new prescription for Zytiga for the treatment of advanced, castration-sensitive prostate cancer in conjunction with prednisone, planned duration until disease progression or unacceptable toxicirty.  Labs from Epic assessed, [place] for treatment, no assessment of liver function in 2018. CMET is ordered for follow-up visit with MD.  Current medication list in Epic reviewed, no DDIs with Zytiga identified.  Prescription has been e-scribed to the [facility] for benefits analysis and approval.  I called patient to provide update on prescription status and offer for initial counseling. Patient requests I can back before noon tomorrow.  Oral Oncology Clinic will continue to follow for insurance authorization, copayment issues, initial counseling and start date.  Johny Drilling, PharmD, BCPS, BCOP 11/16/2016 4:21 PM Oral Oncology Clinic [phone]

## 2016-11-17 MED ORDER — PREDNISONE 5 MG PO TABS: 5.0000 mg | ORAL_TABLET | Freq: Every day | ORAL | 3 refills | Status: DC

## 2016-11-17 MED FILL — predniSONE 5 MG TABS: 5 | 30 days supply | Qty: 30 | Fill #0

## 2016-11-17 MED FILL — ZYTIGA 250 MG TABLET: 250 | 30 days supply | Qty: 120 | Fill #0

## 2016-11-17 NOTE — Telephone Encounter (Signed)
Oral Chemotherapy Pharmacist Encounter   I spoke with patient for overview of new oral chemotherapy medication: Zytiga for the treatment of advanced, castration-sensitive prostate cancer in conjunction with prednisone, planned duration until disease progression or unacceptable toxicity.   Pt is doing well. The prescriptions have been sent to the [facility] for benefit analysis and approval.  Copayment $10 per month. This affordable to the patient. Prescription will be prepared at the pharmacy for dispensing tomorrow (11/18/16) and patient will pick it up by the end of this week. Prednisone prescription also sent to [facility] per patient request so he may pick everything up together for prescribed regimen. Patient will start Zytiga the next day after picking it up from the pharmacy,  Counseled patient on administration, dosing, side effects, safe handling, and monitoring.  Patient will take Zytiga 250mg  tablets, 4 tablets (1000mg ) by mouth once daily on an empty stomach, 1 hour before or 2 hours after a meal. Patient states he will take his Zyitga as soon as he wakes up and will wait at least 1 hour before eating breakfast. Patient will take prednisone 5mg  tablet, 1 tablet by mouth one daily with breakfast.  Side effects include but not limited to: peripheral edema, GI upset, hypertension, hot flashes, fatigue, and arthralgias.    Reviewed with patient importance of keeping a medication schedule and plan for any missed doses.  Mr. Salvato voiced understanding and appreciation.   All questions answered.  Patient knows to call the office with questions or concerns. Oral Oncology Clinic will continue to follow.  Thank you,  Johny Drilling, PharmD, BCPS, BCOP 11/17/2016  11:58 AM Oral Oncology Clinic [phone]

## 2016-11-24 ENCOUNTER — Telehealth: Payer: Self-pay | Admitting: Radiation Oncology

## 2016-11-24 NOTE — Telephone Encounter (Signed)
I spoke with the patient and he says he's asymptomatic currently and is unable to attend daily treatments. He has a new job he's started and would like to just go with the medications he's started. He will let us know if he wants to move forward with XRT at a later date.

## 2016-12-15 ENCOUNTER — Telehealth: Payer: Self-pay | Admitting: Oncology

## 2016-12-15 NOTE — Telephone Encounter (Signed)
Patient called in to reschedule his appointment due to work conflicts

## 2016-12-16 ENCOUNTER — Other Ambulatory Visit: Payer: Self-pay | Admitting: Oncology

## 2016-12-16 ENCOUNTER — Ambulatory Visit: Payer: 59 | Admitting: Oncology

## 2016-12-16 ENCOUNTER — Other Ambulatory Visit: Payer: 59

## 2016-12-16 DIAGNOSIS — C61 Malignant neoplasm of prostate: Secondary | ICD-10-CM

## 2016-12-16 MED FILL — ZYTIGA 250 MG TABLET: 250 | 30 days supply | Qty: 120 | Fill #0

## 2016-12-22 MED FILL — predniSONE 5 MG TABS: 5 | 30 days supply | Qty: 30 | Fill #1

## 2016-12-30 ENCOUNTER — Ambulatory Visit (HOSPITAL_BASED_OUTPATIENT_CLINIC_OR_DEPARTMENT_OTHER): Payer: 59 | Admitting: Oncology

## 2016-12-30 ENCOUNTER — Telehealth: Payer: Self-pay

## 2016-12-30 ENCOUNTER — Other Ambulatory Visit (HOSPITAL_BASED_OUTPATIENT_CLINIC_OR_DEPARTMENT_OTHER): Payer: 59

## 2016-12-30 VITALS — BP 146/89 | HR 90 | Temp 98.8°F | Resp 18 | Ht 66.0 in | Wt 204.4 lb

## 2016-12-30 DIAGNOSIS — E876 Hypokalemia: Secondary | ICD-10-CM

## 2016-12-30 DIAGNOSIS — C61 Malignant neoplasm of prostate: Secondary | ICD-10-CM | POA: Diagnosis not present

## 2016-12-30 DIAGNOSIS — I10 Essential (primary) hypertension: Secondary | ICD-10-CM | POA: Diagnosis not present

## 2016-12-30 DIAGNOSIS — E291 Testicular hypofunction: Secondary | ICD-10-CM | POA: Diagnosis not present

## 2016-12-30 LAB — CBC WITH DIFFERENTIAL/PLATELET
BASO%: 0.3 % (ref 0.0–2.0)
BASOS ABS: 0 10*3/uL (ref 0.0–0.1)
EOS ABS: 0.1 10*3/uL (ref 0.0–0.5)
EOS%: 0.8 % (ref 0.0–7.0)
HCT: 38.5 % (ref 38.4–49.9)
HGB: 13.1 g/dL (ref 13.0–17.1)
LYMPH%: 27.2 % (ref 14.0–49.0)
MCH: 32.8 pg (ref 27.2–33.4)
MCHC: 34 g/dL (ref 32.0–36.0)
MCV: 96.5 fL (ref 79.3–98.0)
MONO#: 0.6 10*3/uL (ref 0.1–0.9)
MONO%: 7.6 % (ref 0.0–14.0)
NEUT%: 64.1 % (ref 39.0–75.0)
NEUTROS ABS: 4.8 10*3/uL (ref 1.5–6.5)
Platelets: 281 10*3/uL (ref 140–400)
RBC: 3.99 10*6/uL — ABNORMAL LOW (ref 4.20–5.82)
RDW: 13.1 % (ref 11.0–14.6)
WBC: 7.5 10*3/uL (ref 4.0–10.3)
lymph#: 2 10*3/uL (ref 0.9–3.3)

## 2016-12-30 LAB — COMPREHENSIVE METABOLIC PANEL
ALT: 15 U/L (ref 0–55)
AST: 15 U/L (ref 5–34)
Albumin: 4.1 g/dL (ref 3.5–5.0)
Alkaline Phosphatase: 73 U/L (ref 40–150)
Anion Gap: 10 mEq/L (ref 3–11)
BUN: 16.5 mg/dL (ref 7.0–26.0)
CHLORIDE: 110 meq/L — AB (ref 98–109)
CO2: 21 meq/L — AB (ref 22–29)
Calcium: 9.4 mg/dL (ref 8.4–10.4)
Creatinine: 1 mg/dL (ref 0.7–1.3)
GLUCOSE: 123 mg/dL (ref 70–140)
POTASSIUM: 3.7 meq/L (ref 3.5–5.1)
SODIUM: 141 meq/L (ref 136–145)
Total Bilirubin: 1.22 mg/dL — ABNORMAL HIGH (ref 0.20–1.20)
Total Protein: 7.4 g/dL (ref 6.4–8.3)

## 2016-12-30 NOTE — Telephone Encounter (Signed)
Printed avs and calender for upcoming appointments in January. Per 10/31 los

## 2016-12-30 NOTE — Progress Notes (Signed)
Hematology and Oncology Follow Up Visit  Charles [name] [id number] 03/26/1960 56 y.o. 12/30/2016 10:53 AM Patient, No Pcp PerNo ref. provider found   Principle Diagnosis: 56 year old with advanced prostate cancer diagnosed in June 2018. He presented with a perirectal mass measuring 3.2 x 3.4 x 2.9 cm to the left of the rectum. This was biopsy proven to be prostate cancer on 08/18/2016. His PSA at the time was 698. Bone scan did not show evidence of bony disease or metastasis   Prior Therapy: He is S/P left transgluteal pelvic mass biopsy on 08/28/2016.   Current therapy:  Lupron every 3 months given at [facility] urology.  Zytiga 1000 mg daily with prednisone at 5 mg daily started in September 2018.  Interim History: Charles [name] presents today for a follow-up visit.  Since last visit, he started Zytiga with prednisone and tolerated it well.  He denies any nausea, fatigue, edema or difficulty obtaining this medication.  His performance status and quality of life remain excellent.  He has no problems with urination or defecation.  He denies any hematochezia or melena.  He continues to work full-time.  He does report hot flashes associated with Lupron which has not changed despite Megace trial.  He does not report any headaches, blurry vision, syncope or seizures. He does not report any fevers, chills, sweats or weight loss. He does not report any chest pain, palpitation, orthopnea or leg edema. He does not report any cough, wheezing, hemoptysis or dyspnea exertion. He does not report any nausea, vomiting, abdominal pain, constipation or diarrhea. He does not report any hematuria or dysuria. He does not report any skeletal complaints. He denied any arthralgias or myalgias. Remaining review of systems unremarkable.   Medications: I have reviewed the patient's current medications.  Current Outpatient Prescriptions  Medication Sig Dispense Refill  . megestrol (MEGACE) 20 MG tablet Take 1  tablet by mouth 2 (two) times daily.  3  . predniSONE (DELTASONE) 5 MG tablet Take 1 tablet (5 mg total) by mouth daily with breakfast. 30 tablet 3  . ZYTIGA 250 MG tablet TAKE 4 TABLETS (1,000 MG TOTAL) BY MOUTH DAILY. TAKE ON AN EMPTY STOMACH 1 HOUR BEFORE OR 2 HOURS AFTER A MEAL 120 tablet 0   No current facility-administered medications for this visit.      Allergies: No Known Allergies  Past Medical History, Surgical history, Social history, and Family History were reviewed and updated.  Marland Kitchen Physical Exam: Blood pressure (!) 146/89, pulse 90, temperature 98.8 F (37.1 C), temperature source Oral, resp. rate 18, height 5\' 6"  (1.676 m), weight 204 lb 6.4 oz (92.7 kg), SpO2 100 %. ECOG: 0 General appearance: alert and cooperative appeared without distress. Head: Normocephalic, without obvious abnormality without oral ulcers or lesions. Neck: no adenopathy Lymph nodes: Cervical, supraclavicular, and axillary nodes normal. Heart:regular rate and rhythm, S1, S2 normal, no murmur, click, rub or gallop Lung:chest clear, no wheezing, rales, normal symmetric air entry Abdomin: soft, non-tender, without masses or organomegaly no shifting dullness or ascites. EXT:no erythema, induration, or nodules   Lab Results: Lab Results  Component Value Date   WBC 7.5 12/30/2016   HGB 13.1 12/30/2016   HCT 38.5 12/30/2016   MCV 96.5 12/30/2016   PLT 281 12/30/2016     Chemistry      Component Value Date/Time   NA 142 03/20/2015 1500   K 3.8 03/20/2015 1500   CL 107 03/20/2015 1500   CO2 25 03/20/2015 1500   BUN 14  03/20/2015 1500   CREATININE 1.00 03/20/2015 1500      Component Value Date/Time   CALCIUM 9.2 03/20/2015 1500   ALKPHOS 59 03/20/2015 1500   AST 20 03/20/2015 1500   ALT 19 03/20/2015 1500   BILITOT 0.2 (L) 03/20/2015 1500       Impression and Plan:  56 year old with the following issues:  1. Advanced prostate cancer diagnosed in June 2018. He presented with a  perirectal mass measuring 3.2 x 3.4 x 2.9 cm to the left of the rectum. This was biopsy proven to be prostate cancer on 08/18/2016. His PSA at the time was 698. Bone scan did not show evidence of bony disease or metastasis.  He is currently on Lupron every 3 months under the care of Dr. Alyson [name].  He started Charles [name] and prednisone in September 2018 given his high risk disease.  He tolerated therapy very well without any issues.  His PSA is currently pending.  Risks and benefits of continuing this medication was discussed today and is agreeable to continue.  The rationale for using local therapy with radiation was discussed today in detail.  He has been referred to radiation oncology but deferred that option at this time.  I explained to him that local control may be an issue in the future including problems with urination, defecation or pain.  He understands these risks and would like to defer radiation to a later date.  2. Androgen depravation: He'll continue Lupron every 3 months under the care of Dr. Alyson [name] indefinitely.  3.  Hypokalemia: His potassium will be monitored periodically and replace as needed.  4.  Liver function surveillance: His LFTs will be periodically monitored on Zytiga.  5.  Hypertension: His blood pressure remains within normal range and will be monitored on Zytiga.  6.  Follow-up: I will be in 8 weeks to follow his progress.   Charles Button, MD 10/31/201810:53 AM

## 2016-12-31 LAB — PSA: PROSTATE SPECIFIC AG, SERUM: 13.3 ng/mL — AB (ref 0.0–4.0)

## 2017-01-08 ENCOUNTER — Other Ambulatory Visit: Payer: Self-pay | Admitting: Oncology

## 2017-01-08 DIAGNOSIS — C61 Malignant neoplasm of prostate: Secondary | ICD-10-CM

## 2017-01-11 ENCOUNTER — Other Ambulatory Visit: Payer: Self-pay | Admitting: Pharmacist

## 2017-01-11 DIAGNOSIS — C61 Malignant neoplasm of prostate: Secondary | ICD-10-CM

## 2017-01-11 MED ORDER — ABIRATERONE ACETATE 250 MG PO TABS: 1000.0000 mg | ORAL_TABLET | Freq: Every day | ORAL | 0 refills | Status: DC

## 2017-01-16 IMAGING — CT CT ABD-PELV W/ CM
2 of 5 series · 15 of 46 positions shown, 17 images · IV contrast (Omni 300)
Comparison: None.

CLINICAL DATA: Chronic bright red blood in stool, with sensation of
rectal knot. Initial encounter.

EXAM:
CT ABDOMEN AND PELVIS WITH CONTRAST
TECHNIQUE: Multidetector CT imaging of the abdomen and pelvis was performed
using the standard protocol following bolus administration of
intravenous contrast.
CONTRAST:  100mL OMNIPAQUE IOHEXOL 300 MG/ML  SOLN

[Series 2: a/p w/ 5mm · axial · 0.76mm/px · z∈[-701,-241]mm · 12 of 104 slices shown, 14 images]
[im 6/104  soft-tissue]
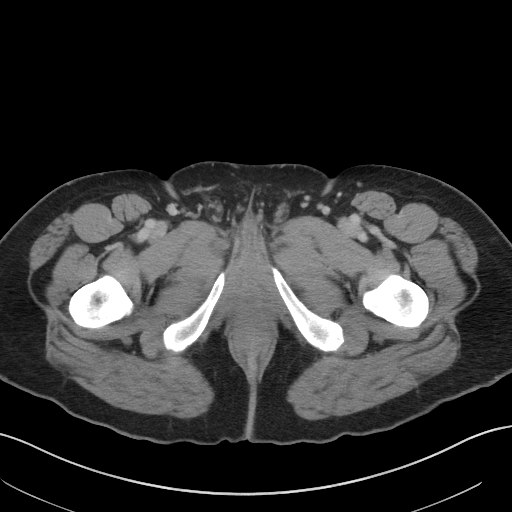
[im 6/104  bone]
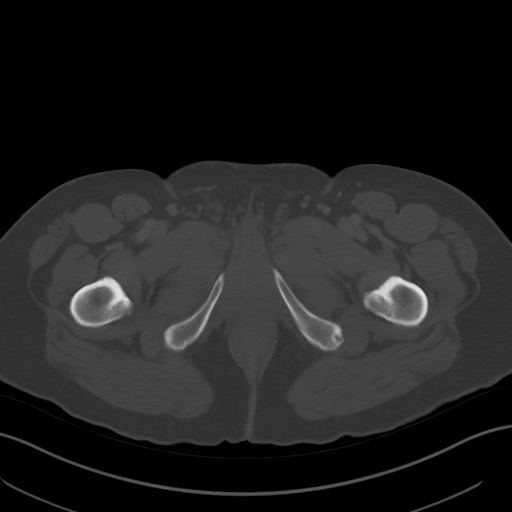
[im 17/104  soft-tissue]
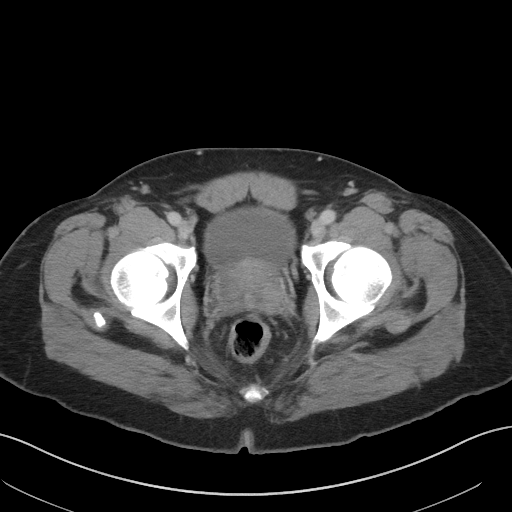
[im 22/104  soft-tissue]
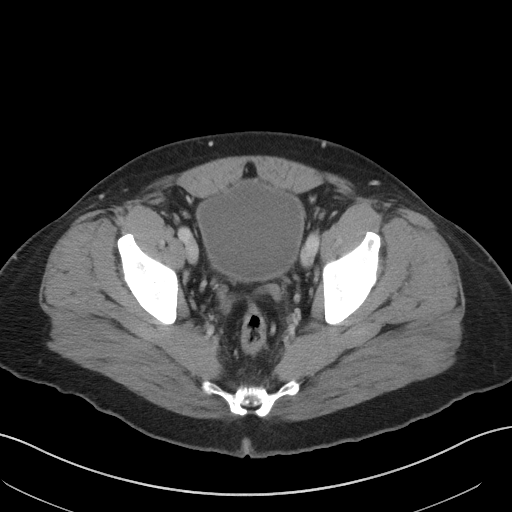
[im 33/104  soft-tissue]
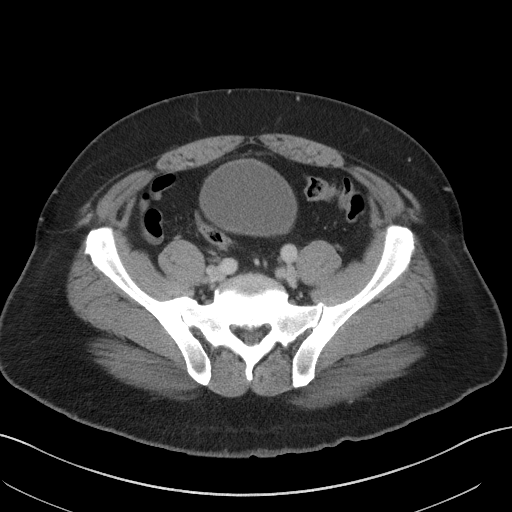
[im 38/104  soft-tissue]
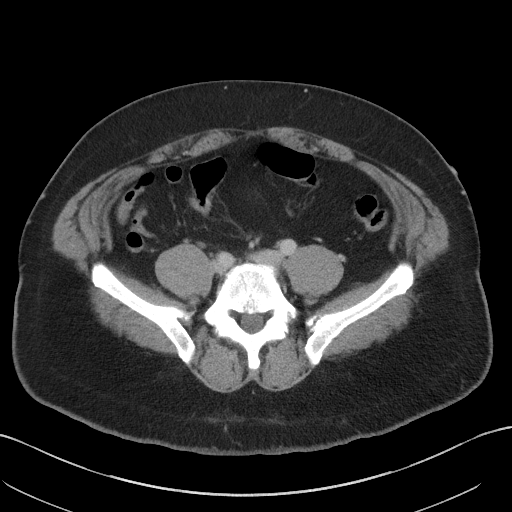
[im 49/104  soft-tissue]
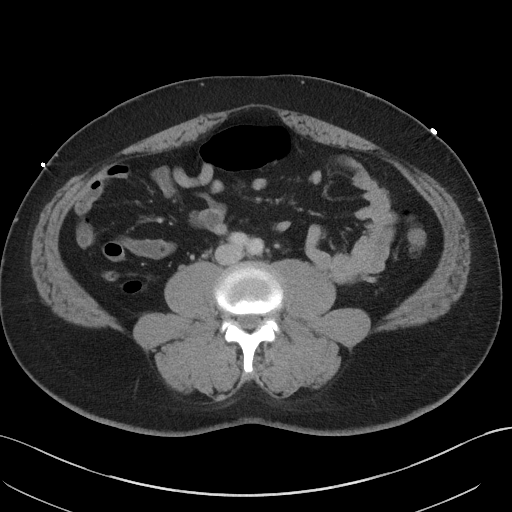
[im 55/104  soft-tissue]
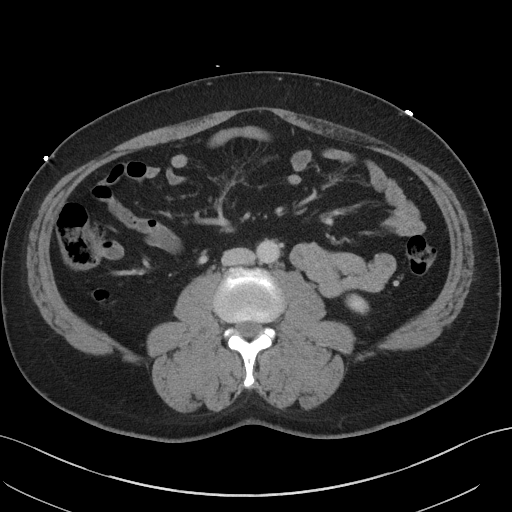
[im 66/104  soft-tissue]
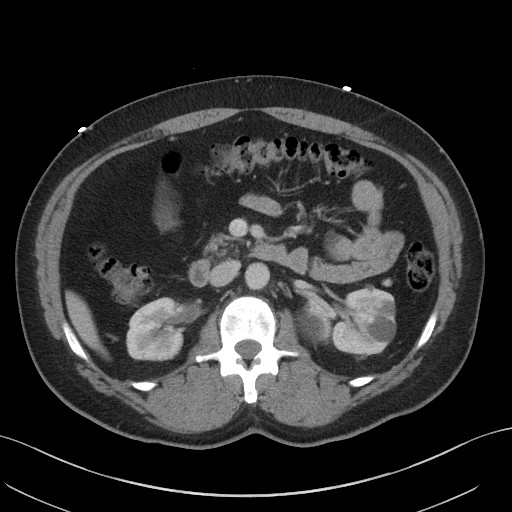
[im 71/104  soft-tissue]
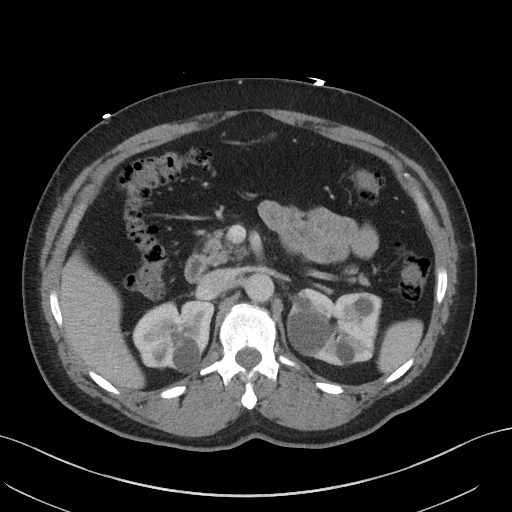
[im 71/104  bone]
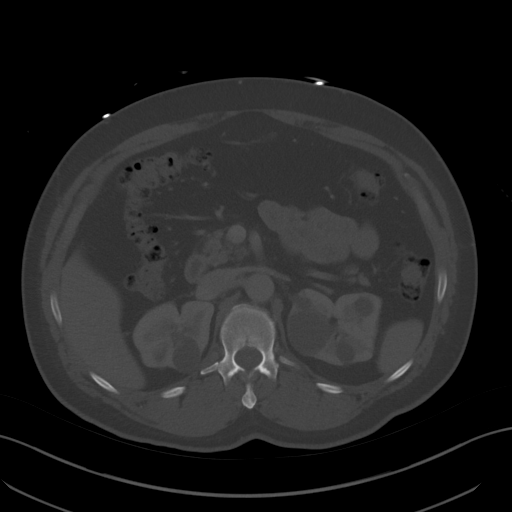
[im 82/104  soft-tissue]
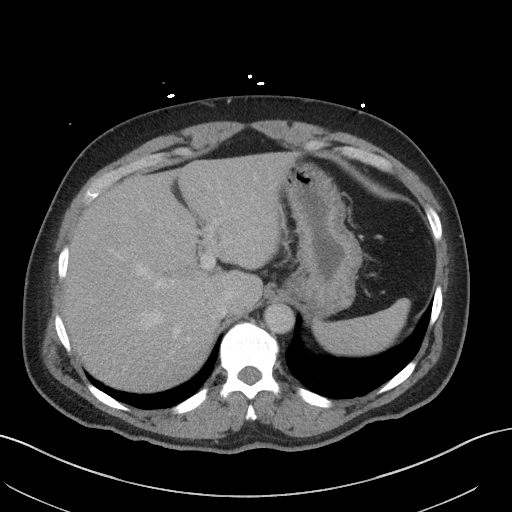
[im 87/104  soft-tissue]
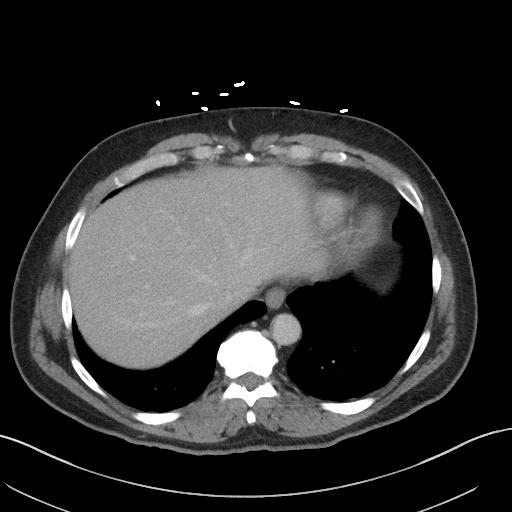
[im 98/104  soft-tissue]
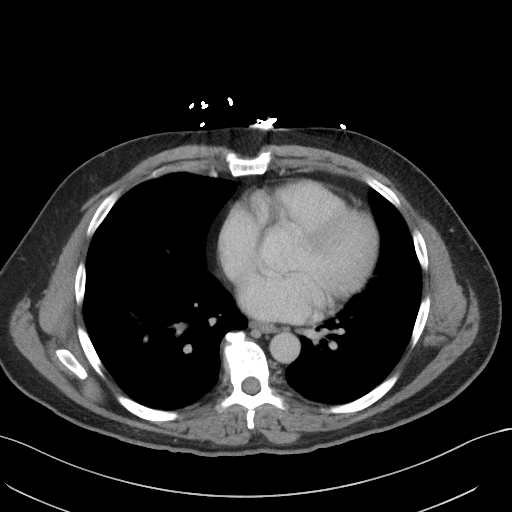

[Series 5: a/p w/ cor · coronal · 0.76mm/px · 3 of 128 slices shown]
[im 43/128  soft-tissue]
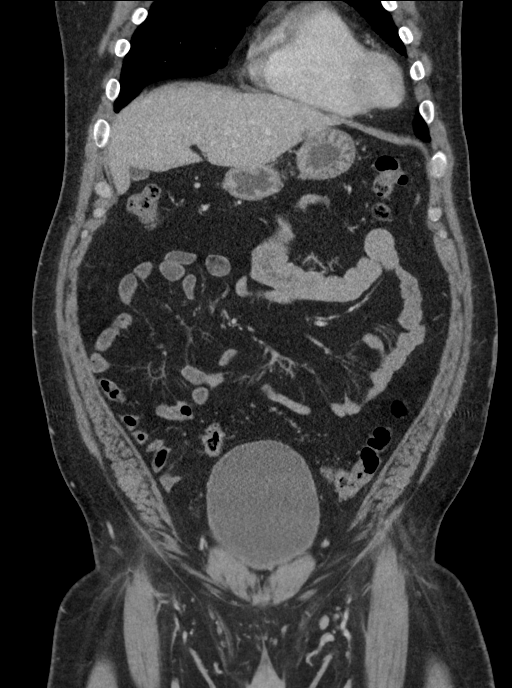
[im 57/128  soft-tissue]
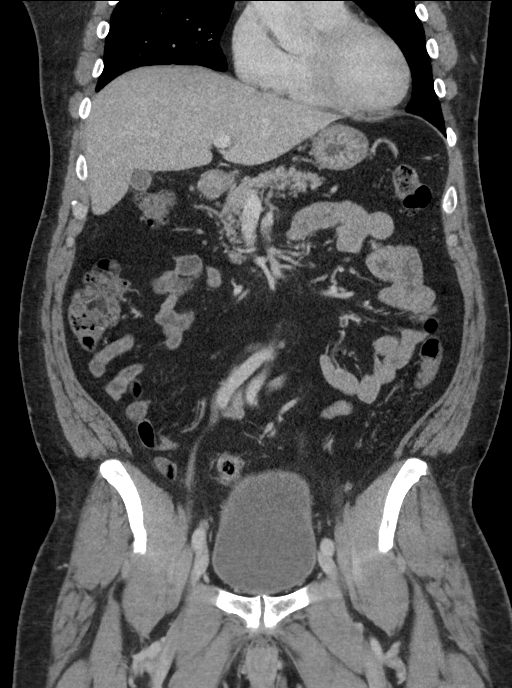
[im 71/128  soft-tissue]
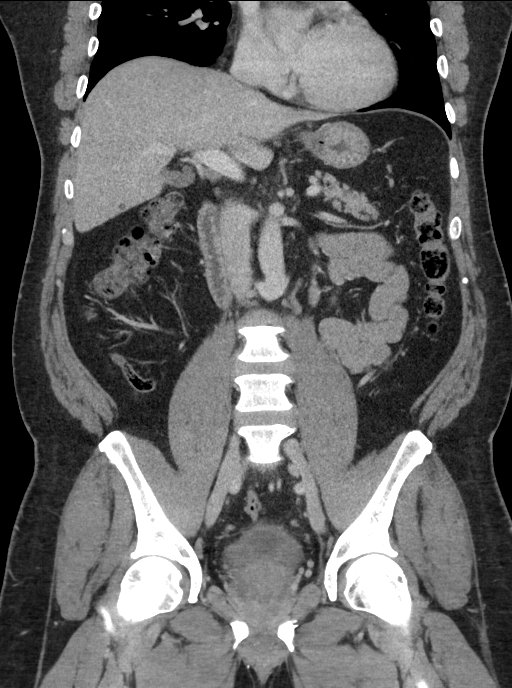

[15 of 46 positions shown; findings below may reference images not displayed]

FINDINGS: The visualized lung bases are clear.

Small hepatic hypodensities measure up to 1.1 cm in size,
nonspecific in appearance. The gallbladder is within normal limits.
The pancreas and adrenal glands are unremarkable.

Scattered bilateral renal cysts are noted, measuring up to 4.3 cm in
size, with mild distortion of the renal parenchyma. The kidneys are
otherwise grossly unremarkable. There is no evidence of
hydronephrosis. No renal or ureteral stones are seen. No perinephric
stranding is appreciated.

No free fluid is identified. The small bowel is unremarkable in
appearance. The stomach is within normal limits. No acute vascular
abnormalities are seen.

The appendix is normal in caliber and filled with air, without
evidence of appendicitis. The colon is grossly unremarkable in
appearance.

There is an unusual 1.4 cm node adjacent to the rectum, on the left
side. The anorectal canal is grossly unremarkable in appearance.

The bladder is mildly distended and grossly unremarkable. The
prostate is enlarged, measuring 5.6 cm in diameter, with impression
on the base of the bladder. No inguinal lymphadenopathy is seen.

No acute osseous abnormalities are identified. A small bone cyst is
noted within the right ilium.
IMPRESSION: 1. Unusual 1.4 cm enlarged node adjacent to the rectum, on the left
side. No primary malignancy is seen. Given the unusual location of
this node, and the patient's symptoms, PET/CT is recommended for
further evaluation, to assess for underlying malignancy.
Alternatively, biopsy could be considered if deemed clinically
appropriate.
2. The anorectal canal is unremarkable in appearance. The rectum and
colon are grossly unremarkable. No definite source for the patient's
bright red blood per rectum seen on CT.
3. Enlarged prostate noted, impressing on the base of the bladder.
Would correlate with PSA.
4. Bilateral renal cysts noted.
5. Small nonspecific hypodensities within the liver measure up to
1.1 cm.

## 2017-01-27 MED FILL — predniSONE 5 MG TABS: 5 | 30 days supply | Qty: 30 | Fill #2

## 2017-02-01 ENCOUNTER — Encounter (HOSPITAL_COMMUNITY): Payer: Self-pay

## 2017-02-01 ENCOUNTER — Other Ambulatory Visit: Payer: Self-pay

## 2017-02-01 ENCOUNTER — Emergency Department (HOSPITAL_COMMUNITY): Payer: 59

## 2017-02-01 ENCOUNTER — Emergency Department (HOSPITAL_COMMUNITY)
Admission: EM | Admit: 2017-02-01 | Discharge: 2017-02-01 | Disposition: A | Discharge status: Home / Self Care | Payer: 59 | Attending: Emergency Medicine | Admitting: Emergency Medicine

## 2017-02-01 DIAGNOSIS — N189 Chronic kidney disease, unspecified: Secondary | ICD-10-CM | POA: Diagnosis not present

## 2017-02-01 DIAGNOSIS — Z79899 Other long term (current) drug therapy: Secondary | ICD-10-CM | POA: Insufficient documentation

## 2017-02-01 DIAGNOSIS — S96912A Strain of unspecified muscle and tendon at ankle and foot level, left foot, initial encounter: Secondary | ICD-10-CM | POA: Diagnosis not present

## 2017-02-01 DIAGNOSIS — Y999 Unspecified external cause status: Secondary | ICD-10-CM | POA: Insufficient documentation

## 2017-02-01 DIAGNOSIS — X58XXXA Exposure to other specified factors, initial encounter: Secondary | ICD-10-CM | POA: Insufficient documentation

## 2017-02-01 DIAGNOSIS — C61 Malignant neoplasm of prostate: Secondary | ICD-10-CM | POA: Insufficient documentation

## 2017-02-01 DIAGNOSIS — Y939 Activity, unspecified: Secondary | ICD-10-CM | POA: Diagnosis not present

## 2017-02-01 DIAGNOSIS — Y929 Unspecified place or not applicable: Secondary | ICD-10-CM | POA: Diagnosis not present

## 2017-02-01 DIAGNOSIS — S99912A Unspecified injury of left ankle, initial encounter: Secondary | ICD-10-CM | POA: Diagnosis present

## 2017-02-01 DIAGNOSIS — I129 Hypertensive chronic kidney disease with stage 1 through stage 4 chronic kidney disease, or unspecified chronic kidney disease: Secondary | ICD-10-CM | POA: Insufficient documentation

## 2017-02-01 HISTORY — DX: Essential (primary) hypertension: I10

## 2017-02-01 NOTE — ED Triage Notes (Signed)
Patient c/o left ankle swelling and pain x 1 week. Patient denies any injury.

## 2017-02-01 NOTE — ED Notes (Signed)
ED Provider at bedside. 

## 2017-02-01 NOTE — ED Notes (Signed)
Patient transported to X-ray 

## 2017-02-01 NOTE — ED Provider Notes (Signed)
[facility] DEPT Provider Note   CSN: [id number] Arrival date & time: 02/01/17  8676     History   Chief Complaint Chief Complaint  Patient presents with  . Joint Swelling  . Hypertension    HPI JESSIE SCHRIEBER is a 56 y.o. male.  56 year old male presents with complaint of left ankle pain and swelling.  Reports that the symptoms started about 1 week prior.  He denies a specific inciting injury.  He is able to ambulate without difficulty.  He denies fever, erythema, calf pain, other complaint. He denies prior history of gout or other joint arthropathy.    The history is provided by the patient.  Ankle Pain   The incident occurred more than 1 week ago. The incident occurred at home. There was no injury mechanism. The pain is present in the left ankle. The quality of the pain is described as aching. The pain is mild. The pain has been constant since onset. Pertinent negatives include no inability to bear weight, no loss of motion and no loss of sensation. He reports no foreign bodies present. Nothing aggravates the symptoms. He has tried nothing for the symptoms. The treatment provided no relief.    Past Medical History:  Diagnosis Date  . Blurred vision   . Chronic kidney disease    renal cyst  . Diverticulosis   . Hemorrhoids   . Hypertension   . Prostate cancer ([place])   . Rectal bleeding     Patient Active Problem List   Diagnosis Date Noted  . Malignant neoplasm of prostate ([place]) 09/17/2016    Past Surgical History:  Procedure Laterality Date  . CIRCUMCISION    . needle core biopsy         Home Medications    Prior to Admission medications   Medication Sig Start Date End Date Taking? Authorizing Provider  abiraterone Acetate (ZYTIGA) 250 MG tablet Take 4 tablets (1,000 mg total) daily by mouth. Take on an empty stomach 1 hour before or 2 hours after a meal 01/11/17  Yes Shadad, Mathis Dad, MD  megestrol (MEGACE) 20 MG tablet  Take 1 tablet by mouth 2 (two) times daily. 10/19/16  Yes [provider]  predniSONE (DELTASONE) 5 MG tablet Take 1 tablet (5 mg total) by mouth daily with breakfast. 11/17/16  Yes Shadad, Mathis Dad, MD    Family History Family History  Problem Relation Age of Onset  . Colon cancer Neg Hx   . Esophageal cancer Neg Hx   . Rectal cancer Neg Hx   . Stomach cancer Neg Hx   . Cancer Neg Hx     Social History Social History   Tobacco Use  . Smoking status: Never Smoker  . Smokeless tobacco: Never Used  Substance Use Topics  . Alcohol use: Yes    Alcohol/week: 1.2 oz    Types: 2 Cans of beer per week    Comment: daily use /2 24ouce cans  . Drug use: No     Allergies   Patient has no known allergies.   Review of Systems Review of Systems  Musculoskeletal:       Left ankle pain.   All other systems reviewed and are negative.    Physical Exam Updated Vital Signs BP (!) 148/100   Pulse (!) 107   Temp (!) 97.4 F (36.3 C) (Oral)   Resp 16   Ht 5\' 6"  (1.676 m)   Wt 90.7 kg (200 lb)  SpO2 97%   BMI 32.28 kg/m   Physical Exam  Constitutional: He is oriented to person, place, and time. He appears well-developed and well-nourished. No distress.  HENT:  Head: Normocephalic and atraumatic.  Mouth/Throat: Oropharynx is clear and moist.  Eyes: Conjunctivae and EOM are normal. Pupils are equal, round, and reactive to light.  Neck: Normal range of motion. Neck supple.  Cardiovascular: Normal rate, regular rhythm and normal heart sounds.  Pulmonary/Chest: Effort normal and breath sounds normal. No respiratory distress.  Abdominal: Soft. He exhibits no distension. There is no tenderness.  Musculoskeletal: Normal range of motion. He exhibits no edema or deformity.  Mild tenderness to lateral aspect of left ankle.    No deformity. No stepoff.   Full active range of motion of the left ankle.  Distal LLE is NVI.   Neurological: He is alert and oriented to person,  place, and time.  Skin: Skin is warm and dry.  Psychiatric: He has a normal mood and affect.  Nursing note and vitals reviewed.    ED Treatments / Results  Labs (all labs ordered are listed, but only abnormal results are displayed) Labs Reviewed - No data to display  EKG  EKG Interpretation None       Radiology Dg Ankle Complete Left  Result Date: 02/01/2017 CLINICAL DATA:  Onset of left ankle swelling 2 and half weeks ago with pain over the lateral malleolus. No known injury. EXAM: LEFT ANKLE COMPLETE - 3+ VIEW COMPARISON:  None in PACs FINDINGS: The bones are subjectively adequately mineralized. The ankle joint mortise is preserved. The talar dome is intact. There is no lytic or blastic lesion or acute fracture. There is mild soft tissue swelling anteriorly and laterally. IMPRESSION: There is no acute or significant chronic bony abnormality of the left ankle. There is a moderate amount of soft tissue swelling anteriorly and laterally. Electronically Signed   By: Kyndal  Martinique M.D.   On: 02/01/2017 13:15    Procedures Procedures (including critical care time)  Medications Ordered in ED Medications - No data to display   Initial Impression / Assessment and Plan / ED Course  I have reviewed the triage vital signs and the nursing notes.  Pertinent labs & imaging results that were available during my care of the patient were reviewed by me and considered in my medical decision making (see chart for details).     MSE complete  Patient is consistent with a left ankle strain. There is no significant acute findings on x-ray.  Patient understands the need for close follow-up.  Strict return precautions given and understood.  Final Clinical Impressions(s) / ED Diagnoses   Final diagnoses:  Strain of left ankle, initial encounter    ED Discharge Orders    None       Valarie Merino, MD 02/01/17 1340

## 2017-02-08 ENCOUNTER — Other Ambulatory Visit: Payer: Self-pay | Admitting: Oncology

## 2017-02-08 DIAGNOSIS — C61 Malignant neoplasm of prostate: Secondary | ICD-10-CM

## 2017-03-03 ENCOUNTER — Telehealth: Payer: Self-pay | Admitting: Oncology

## 2017-03-03 ENCOUNTER — Other Ambulatory Visit (HOSPITAL_BASED_OUTPATIENT_CLINIC_OR_DEPARTMENT_OTHER): Payer: 59

## 2017-03-03 ENCOUNTER — Ambulatory Visit (HOSPITAL_BASED_OUTPATIENT_CLINIC_OR_DEPARTMENT_OTHER): Payer: 59 | Admitting: Oncology

## 2017-03-03 VITALS — BP 148/88 | HR 107 | Temp 98.7°F | Resp 20 | Ht 66.0 in | Wt 207.1 lb

## 2017-03-03 DIAGNOSIS — E291 Testicular hypofunction: Secondary | ICD-10-CM | POA: Diagnosis not present

## 2017-03-03 DIAGNOSIS — E876 Hypokalemia: Secondary | ICD-10-CM

## 2017-03-03 DIAGNOSIS — I10 Essential (primary) hypertension: Secondary | ICD-10-CM

## 2017-03-03 DIAGNOSIS — C61 Malignant neoplasm of prostate: Secondary | ICD-10-CM

## 2017-03-03 LAB — CBC WITH DIFFERENTIAL/PLATELET
BASO%: 0.5 % (ref 0.0–2.0)
Basophils Absolute: 0.1 10*3/uL (ref 0.0–0.1)
EOS%: 0.2 % (ref 0.0–7.0)
Eosinophils Absolute: 0 10*3/uL (ref 0.0–0.5)
HEMATOCRIT: 36.2 % — AB (ref 38.4–49.9)
HEMOGLOBIN: 12.4 g/dL — AB (ref 13.0–17.1)
LYMPH#: 1.2 10*3/uL (ref 0.9–3.3)
LYMPH%: 9.9 % — AB (ref 14.0–49.0)
MCH: 33.1 pg (ref 27.2–33.4)
MCHC: 34.4 g/dL (ref 32.0–36.0)
MCV: 96.3 fL (ref 79.3–98.0)
MONO#: 0.5 10*3/uL (ref 0.1–0.9)
MONO%: 4.2 % (ref 0.0–14.0)
NEUT%: 85.2 % — ABNORMAL HIGH (ref 39.0–75.0)
NEUTROS ABS: 10 10*3/uL — AB (ref 1.5–6.5)
PLATELETS: 294 10*3/uL (ref 140–400)
RBC: 3.75 10*6/uL — ABNORMAL LOW (ref 4.20–5.82)
RDW: 13.2 % (ref 11.0–14.6)
WBC: 11.7 10*3/uL — AB (ref 4.0–10.3)

## 2017-03-03 LAB — COMPREHENSIVE METABOLIC PANEL
ALBUMIN: 4.3 g/dL (ref 3.5–5.0)
ALK PHOS: 66 U/L (ref 40–150)
ALT: 17 U/L (ref 0–55)
AST: 15 U/L (ref 5–34)
Anion Gap: 8 mEq/L (ref 3–11)
BUN: 13.4 mg/dL (ref 7.0–26.0)
CALCIUM: 9.3 mg/dL (ref 8.4–10.4)
CHLORIDE: 112 meq/L — AB (ref 98–109)
CO2: 23 mEq/L (ref 22–29)
Creatinine: 0.9 mg/dL (ref 0.7–1.3)
Glucose: 122 mg/dl (ref 70–140)
POTASSIUM: 3.6 meq/L (ref 3.5–5.1)
SODIUM: 144 meq/L (ref 136–145)
Total Bilirubin: 0.87 mg/dL (ref 0.20–1.20)
Total Protein: 7.5 g/dL (ref 6.4–8.3)

## 2017-03-03 NOTE — Progress Notes (Signed)
Hematology and Oncology Follow Up Visit  Charles [name] [id number] 1961-02-05 57 y.o. 03/03/2017 1:39 PM Patient, No Pcp PerNo ref. provider found   Principle Diagnosis: 57 year old with advanced prostate cancer diagnosed in June 2018. He presented with a perirectal mass measuring 3.2 x 3.4 x 2.9 cm to the left of the rectum. This was biopsy proven to be prostate cancer on 08/18/2016. His PSA at the time was 698. Bone scan did not show evidence of bony disease or metastasis   Prior Therapy: He is S/P left transgluteal pelvic mass biopsy on 08/28/2016.   Current therapy:  Lupron every 3 months given at [facility] urology.  Zytiga 1000 mg daily with prednisone at 5 mg daily started in September 2018.  Interim History: Charles [name] presents today for a follow-up visit.  Since last visit, he reports no recent complaints.  He was seen in the emergency department on February 01, 2017 for a ankle pain and x-ray did not show any evidence of fracture.  He reports his pain is improved since that time.  He continues to work full-time without any decline in ability to do so.    He continues to take Zytiga with prednisone without complications.  He denies any nausea, fatigue, edema or difficulty obtaining this medication. He has no problems with urination or defecation.  He denies any hematochezia or melena.  He continues to take Lupron every 3 months without any problems.  He does not report any headaches, blurry vision, syncope or seizures. He does not report any fevers, chills, sweats or weight loss. He does not report any chest pain, palpitation, orthopnea or leg edema. He does not report any cough, wheezing, hemoptysis or dyspnea exertion. He does not report any nausea, vomiting, abdominal pain, constipation or diarrhea. He does not report any hematuria or dysuria. He does not report any skeletal complaints. He denied any arthralgias or myalgias. Remaining review of systems unremarkable.    Medications: I have reviewed the patient's current medications.  Current Outpatient Medications  Medication Sig Dispense Refill  . megestrol (MEGACE) 20 MG tablet Take 1 tablet by mouth 2 (two) times daily.  3  . predniSONE (DELTASONE) 5 MG tablet Take 1 tablet (5 mg total) by mouth daily with breakfast. 30 tablet 3  . ZYTIGA 250 MG tablet TAKE 4 TABLETS (1,000MG ) BY MOUTH ONCE DAILY ON AN EMPTY STOMACH 1 HOUR BEFORE OR 2 HOURS AFTER A MEAL 120 tablet 0   No current facility-administered medications for this visit.      Allergies: No Known Allergies  Past Medical History, Surgical history, Social history, and Family History were reviewed and updated.  Marland Kitchen Physical Exam: Blood pressure (!) 148/88, pulse (!) 107, temperature 98.7 F (37.1 C), temperature source Oral, resp. rate 20, height 5\' 6"  (1.676 m), weight 207 lb 1.6 oz (93.9 kg), SpO2 99 %. ECOG: 0 General appearance: Well-appearing gentleman without distress. Head: Normocephalic, without obvious abnormality without oral ulcers or lesions. Neck: no adenopathy or neck masses. Lymph nodes: Cervical, supraclavicular, and axillary nodes normal. Heart:regular rate and rhythm, S1, S2 normal, no murmur, click, rub or gallop Lung:chest clear, no wheezing, rales, normal symmetric air entry Abdomin: soft, non-tender, without masses or organomegaly no oral thrush or ulcers. EXT:no erythema, induration, or nodules   Lab Results: Lab Results  Component Value Date   WBC 11.7 (H) 03/03/2017   HGB 12.4 (L) 03/03/2017   HCT 36.2 (L) 03/03/2017   MCV 96.3 03/03/2017   PLT 294 03/03/2017  Chemistry      Component Value Date/Time   NA 141 12/30/2016 1024   K 3.7 12/30/2016 1024   CL 107 03/20/2015 1500   CO2 21 (L) 12/30/2016 1024   BUN 16.5 12/30/2016 1024   CREATININE 1.0 12/30/2016 1024      Component Value Date/Time   CALCIUM 9.4 12/30/2016 1024   ALKPHOS 73 12/30/2016 1024   AST 15 12/30/2016 1024   ALT 15 12/30/2016  1024   BILITOT 1.22 (H) 12/30/2016 1024       Impression and Plan:  57 year old with the following issues:  1. Advanced prostate cancer diagnosed in June 2018. He presented with a perirectal mass measuring 3.2 x 3.4 x 2.9 cm to the left of the rectum. This was biopsy proven to be prostate cancer on 08/18/2016. His PSA at the time was 698. Bone scan did not show evidence of bony disease or metastasis.  He is currently on Lupron every 3 months under the care of Dr. Alyson Ingles.  He started [place] and prednisone in September 2018 given his high risk disease.    He continues to tolerate this medication well with excellent response to therapy currently PSA of 13.  Extent benefits of continuing this medication was reviewed today and is agreeable to continue.  We will continue the same dose and schedule and consider dose reductions in the future.  2. Androgen depravation: He'll continue Lupron every 3 months under the care of Dr. Alyson Ingles indefinitely.  3.  Hypokalemia: His potassium will be monitored periodically and replace as needed.  4.  Liver function surveillance: His LFTs will be periodically monitored on Zytiga.  5.  Hypertension: His blood pressure close to normal range and will continue to be monitored periodically.  6.  Follow-up: will be in 8 weeks to follow his progress.   Charles Button, MD 1/2/20191:39 PM

## 2017-03-03 NOTE — Telephone Encounter (Signed)
Gave patient avs and calendar with appts per 1/2 los.  °

## 2017-03-04 LAB — PSA: Prostate Specific Ag, Serum: 2.4 ng/mL (ref 0.0–4.0)

## 2017-03-04 LAB — TESTOSTERONE

## 2017-03-12 ENCOUNTER — Other Ambulatory Visit: Payer: Self-pay | Admitting: *Deleted

## 2017-03-12 DIAGNOSIS — C61 Malignant neoplasm of prostate: Secondary | ICD-10-CM

## 2017-03-12 MED ORDER — ABIRATERONE ACETATE 250 MG PO TABS: ORAL_TABLET | ORAL | 0 refills | Status: DC

## 2017-03-16 ENCOUNTER — Telehealth: Payer: Self-pay | Admitting: Pharmacy Technician

## 2017-03-16 NOTE — Telephone Encounter (Signed)
Oral Oncology Patient Advocate Encounter  Received notification from [employer] that prior authorization for Zytiga is required.  PA submitted on CoverMyMeds Key [id number] Status is pending  Oral Oncology Clinic will continue to follow.  Fabio Asa. Melynda Keller, [place] Patient [place] [phone] 03/16/2017 11:55 AM

## 2017-03-17 NOTE — Telephone Encounter (Signed)
Oral Oncology Patient Advocate Encounter  Notified by [employer] that this drug does not require prior authorization and is on the patient's list of covered drugs.   I have notified [name] of this.   Fabio Asa. Melynda Keller, [place] Patient [place] [phone] 03/17/2017 2:48 PM

## 2017-03-24 NOTE — Telephone Encounter (Signed)
Oral Oncology Patient Advocate Encounter  [facility] contacted the office with problems processing the patient's prescription.  The insurance company is telling them that Prior Authorization for [place] still required.    Urgent request for PA submitted via phone and approved.    PA# [id number] Effective dates: 03/24/2017 through 03/24/2018  I have notified [facility] of the approval.  Oral Oncology Clinic will continue to follow.   Fabio Asa. Melynda Keller, [place] Patient [place] [phone] 03/24/2017 11:46 AM

## 2017-03-29 MED FILL — predniSONE 5 MG TABS: 5 | 30 days supply | Qty: 30 | Fill #3

## 2017-04-22 ENCOUNTER — Other Ambulatory Visit: Payer: Self-pay | Admitting: *Deleted

## 2017-04-22 DIAGNOSIS — C61 Malignant neoplasm of prostate: Secondary | ICD-10-CM

## 2017-04-22 MED ORDER — ABIRATERONE ACETATE 250 MG PO TABS: ORAL_TABLET | ORAL | 0 refills | Status: DC

## 2017-05-11 ENCOUNTER — Telehealth: Payer: Self-pay | Admitting: Oncology

## 2017-05-11 ENCOUNTER — Inpatient Hospital Stay: Payer: 59

## 2017-05-11 ENCOUNTER — Other Ambulatory Visit: Payer: Self-pay | Admitting: *Deleted

## 2017-05-11 ENCOUNTER — Inpatient Hospital Stay: Payer: 59 | Attending: Oncology | Admitting: Oncology

## 2017-05-11 VITALS — BP 137/85 | HR 98 | Temp 98.6°F | Resp 18 | Ht 66.0 in | Wt 208.5 lb

## 2017-05-11 DIAGNOSIS — C61 Malignant neoplasm of prostate: Secondary | ICD-10-CM

## 2017-05-11 DIAGNOSIS — I10 Essential (primary) hypertension: Secondary | ICD-10-CM | POA: Diagnosis not present

## 2017-05-11 DIAGNOSIS — E291 Testicular hypofunction: Secondary | ICD-10-CM

## 2017-05-11 LAB — CBC WITH DIFFERENTIAL/PLATELET
BASOS PCT: 1 %
Basophils Absolute: 0 10*3/uL (ref 0.0–0.1)
EOS ABS: 0.2 10*3/uL (ref 0.0–0.5)
Eosinophils Relative: 2 %
HCT: 38.1 % — ABNORMAL LOW (ref 38.4–49.9)
Hemoglobin: 12.8 g/dL — ABNORMAL LOW (ref 13.0–17.1)
Lymphocytes Relative: 18 %
Lymphs Abs: 1.8 10*3/uL (ref 0.9–3.3)
MCH: 32.2 pg (ref 27.2–33.4)
MCHC: 33.7 g/dL (ref 32.0–36.0)
MCV: 95.6 fL (ref 79.3–98.0)
MONO ABS: 0.6 10*3/uL (ref 0.1–0.9)
MONOS PCT: 6 %
Neutro Abs: 7.3 10*3/uL — ABNORMAL HIGH (ref 1.5–6.5)
Neutrophils Relative %: 73 %
PLATELETS: 374 10*3/uL (ref 140–400)
RBC: 3.98 MIL/uL — ABNORMAL LOW (ref 4.20–5.82)
RDW: 13 % (ref 11.0–14.6)
WBC: 9.9 10*3/uL (ref 4.0–10.3)

## 2017-05-11 LAB — COMPREHENSIVE METABOLIC PANEL
ALK PHOS: 70 U/L (ref 40–150)
ALT: 13 U/L (ref 0–55)
AST: 14 U/L (ref 5–34)
Albumin: 4.3 g/dL (ref 3.5–5.0)
Anion gap: 8 (ref 3–11)
BILIRUBIN TOTAL: 0.7 mg/dL (ref 0.2–1.2)
BUN: 13 mg/dL (ref 7–26)
CALCIUM: 9.9 mg/dL (ref 8.4–10.4)
CO2: 26 mmol/L (ref 22–29)
CREATININE: 0.99 mg/dL (ref 0.70–1.30)
Chloride: 107 mmol/L (ref 98–109)
GFR calc non Af Amer: >60 mL/min (ref >60)
GLUCOSE: 97 mg/dL (ref 70–140)
Potassium: 4.2 mmol/L (ref 3.5–5.1)
Sodium: 141 mmol/L (ref 136–145)
TOTAL PROTEIN: 7.6 g/dL (ref 6.4–8.3)

## 2017-05-11 MED ORDER — PREDNISONE 5 MG PO TABS: 5.0000 mg | ORAL_TABLET | Freq: Every day | ORAL | 3 refills | Status: DC

## 2017-05-11 MED FILL — predniSONE 5 MG TABS: 5 | 30 days supply | Qty: 30 | Fill #0

## 2017-05-11 NOTE — Telephone Encounter (Signed)
Scheduled appt per 3/12 los - patient did not want avs or calendar.  °

## 2017-05-11 NOTE — Progress Notes (Signed)
Hematology and Oncology Follow Up Visit  Charles [name] [id number] [birth year] 57 y.o. 05/11/2017 1:46 PM Patient, No Pcp PerNo ref. provider found   Principle Diagnosis: 57 year old with hormone sensitive advanced prostate cancer with pelvic mass. He was initially diagnosed in June 2018 with PSA at the time was 698.    Prior Therapy: He is S/P left transgluteal pelvic mass biopsy on 08/28/2016.   Current therapy:  Lupron every 3 months given at [facility] urology.  Zytiga 1000 mg daily with prednisone at 5 mg daily started in September 2018.  Interim History: Mr. Charles [name] is here for a follow-up visit. He reports no major changes since the last encounter and continues to take Zytiga and prednisone. He denied any specific complications at this time. He reports no edema, excessive fatigue or tiredness. He denied any hospitalization or illnesses. He denied any bone pain or pathological fractures. He continues to take Lupron at [facility] urology. He is complaining of sexual dysfunction associated with that. His issues are related to the quality of his erections and desire at times.  He continues to work full time in his performance status is excellent. His quality of life remain unchanged.  He does not report any headaches, blurry vision, syncope or seizures. He does not report any fevers, chills, sweats or weight loss. He does not report any chest pain, palpitation, orthopnea or leg edema. He does not report any cough, wheezing, hemoptysis or dyspnea exertion. He does not report any nausea, vomiting, abdominal pain, constipation or diarrhea. He does not report any hematuria or dysuria. He does not report any skeletal complaints. He denies skin rashes or lesions. He denied any lymphadenopathy or petechiae. Remaining review of systems is negative.  Medications: I have reviewed the patient's current medications.  Current Outpatient Medications  Medication Sig Dispense Refill  . abiraterone  acetate (ZYTIGA) 250 MG tablet TAKE 4 TABLETS (1,000MG ) BY MOUTH ONCE DAILY ON AN EMPTY STOMACH 1 HOUR BEFORE OR 2 HOURS AFTER A MEAL 120 tablet 0  . megestrol (MEGACE) 20 MG tablet Take 1 tablet by mouth 2 (two) times daily.  3  . predniSONE (DELTASONE) 5 MG tablet Take 1 tablet (5 mg total) by mouth daily with breakfast. 30 tablet 3   No current facility-administered medications for this visit.      Allergies: No Known Allergies  Past Medical History, Surgical history, Social history, and Family History unchanged from previous examination.  Marland Kitchen Physical Exam: Blood pressure 137/85, pulse 98, temperature 98.6 F (37 C), temperature source Oral, resp. rate 18, height 5\' 6"  (1.676 m), weight 208 lb 8 oz (94.6 kg), SpO2 99 %.   ECOG: 0 General appearance: Alert, awake gentleman without distress. Head: Atraumatic without any abnormalities. Eyes: No scleral icterus. Oropharynx: No mucosal lesions or masses. Lymphatic: No lymphadenopathy palpated in the cervical, axillary and supraclavicular areas. Lung: Clear to auscultation without rhonchi or wheezes. Abdomin: Soft, nontender without any rebound or guarding. Musculoskeletal: No joint deformity or effusion. Skin: No rashes or lesions.   Lab Results: Lab Results  Component Value Date   WBC 11.7 (H) 03/03/2017   HGB 12.4 (L) 03/03/2017   HCT 36.2 (L) 03/03/2017   MCV 96.3 03/03/2017   PLT 294 03/03/2017     Chemistry      Component Value Date/Time   NA 144 03/03/2017 1253   K 3.6 03/03/2017 1253   CL 107 03/20/2015 1500   CO2 23 03/03/2017 1253   BUN 13.4 03/03/2017 1253   CREATININE 0.9  03/03/2017 1253      Component Value Date/Time   CALCIUM 9.3 03/03/2017 1253   ALKPHOS 66 03/03/2017 1253   AST 15 03/03/2017 1253   ALT 17 03/03/2017 1253   BILITOT 0.87 03/03/2017 1253     Results for RYDAN, GULYAS (MRN [id number]) as of 05/11/2017 13:38  Ref. Range 12/30/2016 10:24 03/03/2017 12:53  Prostate Specific Ag, Serum  Latest Ref Range: 0.0 - 4.0 ng/mL 13.3 (H) 2.4    Impression and Plan:  57 year old with the following issues:  1. Hormone sensitive advanced prostate cancer diagnosed in June 2018. He presented with pelvic mass and did not receive any local treatment.  He started [place] and prednisone in September 2018 and continues to tolerated very well. His PSA should excellent response and currently at 2.4 with a castrate level testosterone.  The natural course of this disease was reviewed again with the patient today. The rationale for continuing this therapy was discussed including risks and benefits. Long-term complications associated with Fabio Asa was discussed today and he is agreeable to continue his excellent benefit and lack of toxicities.   2. Androgen depravation: Complication associated with long-term Lupron was reviewed today. He understands decreasing his testosterone level is critical in treating advanced prostate cancer. I recommended he continues Lupron every 3 months under the care of Dr. Alyson Ingles indefinitely.  3.  Hypokalemia: Potassium has been monitored periodically and continues to be within normal range.  4.  Liver function surveillance: No abnormalities noted and on his last liver function tests.  5.  Hypertension: Blood pressure needs to be monitored periodically on Zytiga. No issues reported on this visit.  6. Goals of care: He has excellent performance status with limited advanced disease. Aggressive therapy is warranted although not curative at this time.  7.  Follow-up: will be in 3 months to follow his progress.  25  minutes was spent with the patient face-to-face today.  More than 50% of time was dedicated to patient counseling, education and coordination of his multifaceted care.    Zola Button, MD 3/12/20191:46 PM

## 2017-05-12 LAB — PROSTATE-SPECIFIC AG, SERUM (LABCORP): Prostate Specific Ag, Serum: 0.1 ng/mL (ref 0.0–4.0)

## 2017-05-13 ENCOUNTER — Telehealth: Payer: Self-pay | Admitting: *Deleted

## 2017-05-13 NOTE — Telephone Encounter (Signed)
LM for patient to call me. Re: lab results

## 2017-05-13 NOTE — Telephone Encounter (Signed)
-----   Message from Wyatt Portela, MD sent at 05/12/2017  8:07 AM EDT ----- Please let him know his PSA is low again.

## 2017-05-13 NOTE — Telephone Encounter (Signed)
Patient returned my call. Gave results of last PSA

## 2017-05-26 ENCOUNTER — Encounter: Payer: Self-pay | Admitting: *Deleted

## 2017-05-26 ENCOUNTER — Other Ambulatory Visit: Payer: Self-pay | Admitting: *Deleted

## 2017-05-26 DIAGNOSIS — C61 Malignant neoplasm of prostate: Secondary | ICD-10-CM

## 2017-05-26 MED ORDER — ABIRATERONE ACETATE 250 MG PO TABS: ORAL_TABLET | ORAL | 0 refills | Status: DC

## 2017-06-14 ENCOUNTER — Telehealth: Payer: Self-pay | Admitting: *Deleted

## 2017-06-14 NOTE — Telephone Encounter (Signed)
Patient called and stated,"I started having tension like headaches about two weeks ago. The headaches are located around my neck and back of head. I take an Aspirin and it goes away. So, is this the Zytiga causing the headaches?" Instructed him that Dr. Alen Blew is out of the office today and will return tomorrow. I will call him back once I have discussed this with him. He verbalized understanding.

## 2017-07-08 ENCOUNTER — Other Ambulatory Visit: Payer: Self-pay | Admitting: *Deleted

## 2017-08-13 ENCOUNTER — Inpatient Hospital Stay: Payer: 59

## 2017-08-13 ENCOUNTER — Inpatient Hospital Stay: Payer: 59 | Attending: Oncology | Admitting: Oncology

## 2017-10-06 ENCOUNTER — Other Ambulatory Visit: Payer: Self-pay | Admitting: Oncology

## 2017-10-06 ENCOUNTER — Telehealth: Payer: Self-pay | Admitting: Oncology

## 2017-10-06 DIAGNOSIS — C61 Malignant neoplasm of prostate: Secondary | ICD-10-CM

## 2017-10-06 NOTE — Telephone Encounter (Signed)
Tried to reach regarding voicemail °

## 2017-10-29 ENCOUNTER — Telehealth: Payer: Self-pay | Admitting: *Deleted

## 2017-10-29 NOTE — Telephone Encounter (Signed)
Received a fax from [facility] stating,"we have been unable to contact patient for delivery since last refilled (09/07/17). Eight attempts have been made since 10/10/17. Thank you."

## 2017-11-02 ENCOUNTER — Inpatient Hospital Stay: Payer: 59 | Attending: Oncology

## 2017-11-02 ENCOUNTER — Inpatient Hospital Stay: Payer: 59 | Admitting: Oncology

## 2017-11-05 MED FILL — predniSONE 5 MG TABS: 5 | 30 days supply | Qty: 30 | Fill #1

## 2017-11-15 ENCOUNTER — Telehealth: Payer: Self-pay

## 2017-11-15 NOTE — Telephone Encounter (Signed)
Oral Oncology Patient Advocate Encounter  [name] contacted me because they have not been able to reach the patient about refills. They have called 8 times since August 9th and have still have not spoken to him.  I called him and had to leave a voicemail today.  [place] Patient [place] Phone [phone] Fax [phone]

## 2017-11-22 NOTE — Telephone Encounter (Signed)
Oral Oncology Patient Advocate Encounter  [name] reached out to me again. I called the patient and was able to speak to him. He has medicine right now but is going to call [name] in the morning to follow up with them.  [place] Patient [name] Phone [phone] Fax [phone]

## 2018-02-25 MED FILL — predniSONE 5 MG TABS: 5 | 30 days supply | Qty: 30 | Fill #2

## 2018-03-10 MED FILL — predniSONE 5 MG TABS: 5 | 30 days supply | Qty: 30 | Fill #2

## 2018-04-04 ENCOUNTER — Other Ambulatory Visit: Payer: Self-pay | Admitting: Oncology

## 2018-04-04 DIAGNOSIS — C61 Malignant neoplasm of prostate: Secondary | ICD-10-CM

## 2018-06-17 IMAGING — CT CT BIOPSY
2 series · 12 of 16 positions shown, 14 images · non-contrast
Comparison: none

INDICATION: Left perirectal mass

[Series 2: i-spiral 5.0 b40f · axial · 0.92mm/px · z∈[+894,+992]mm · 6 of 40 slices shown, 8 images]
[im 6/40  soft-tissue]
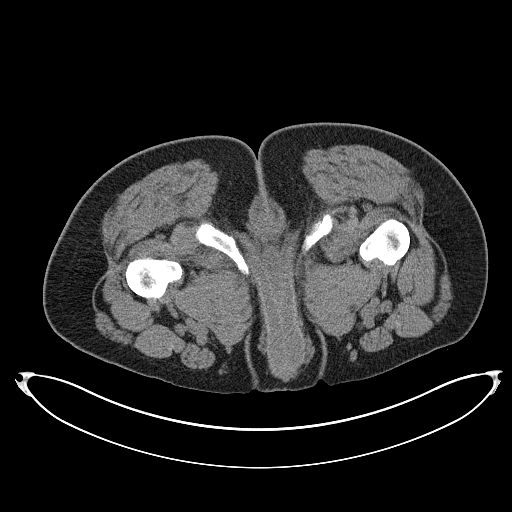
[im 6/40  bone]
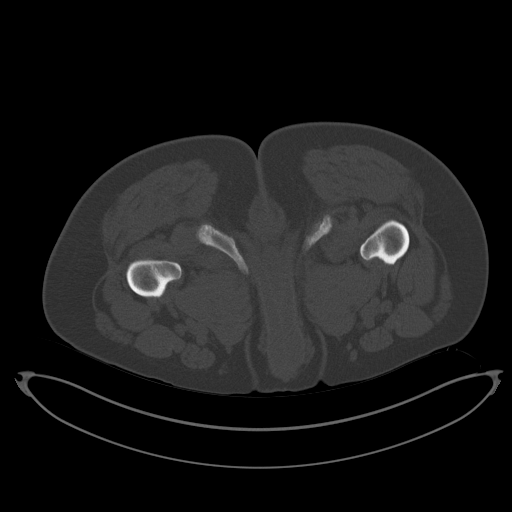
[im 12/40  soft-tissue]
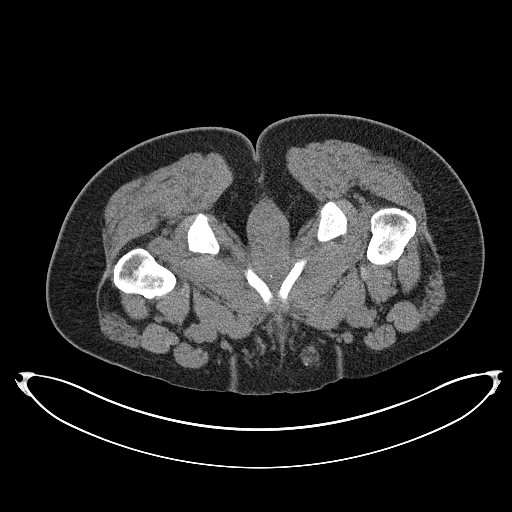
[im 17/40  soft-tissue]
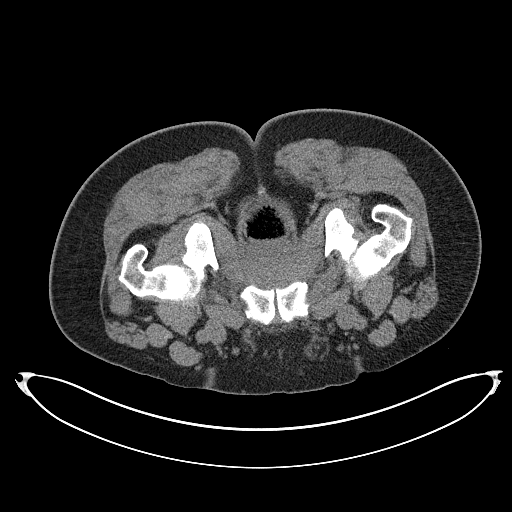
[im 23/40  soft-tissue]
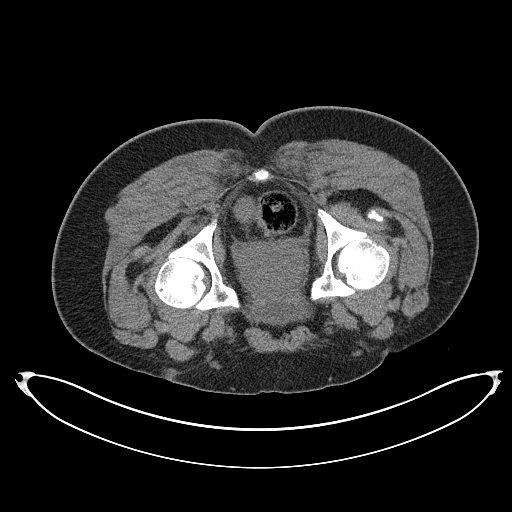
[im 28/40  soft-tissue]
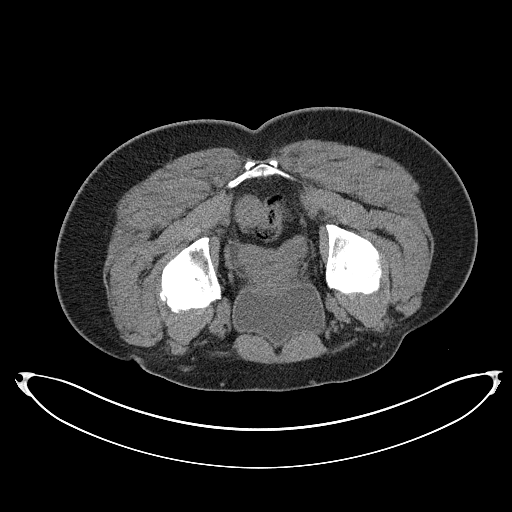
[im 28/40  bone]
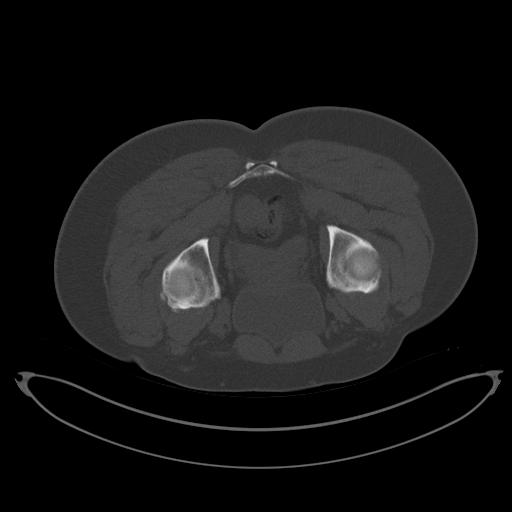
[im 34/40  soft-tissue]
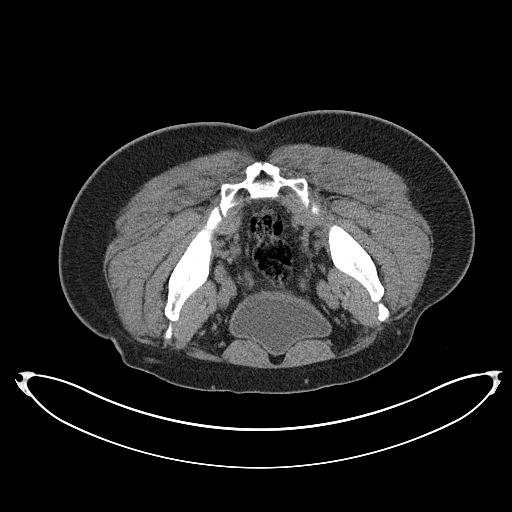

[Series 3: i-sequence 4.8 b40s · axial · 0.92mm/px · z∈[+951,+960]mm · 6 of 36 slices shown]
[im 6/36  soft-tissue]
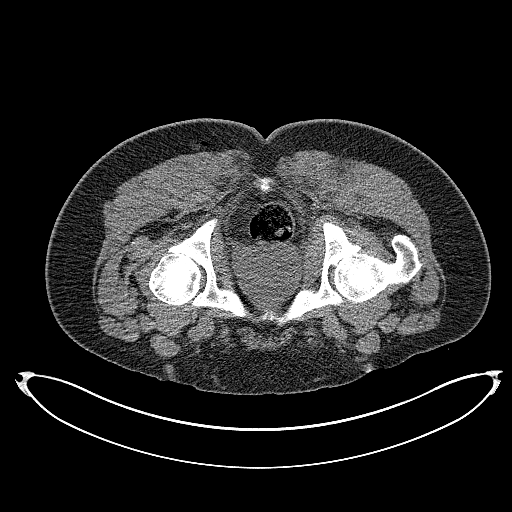
[im 11/36  soft-tissue]
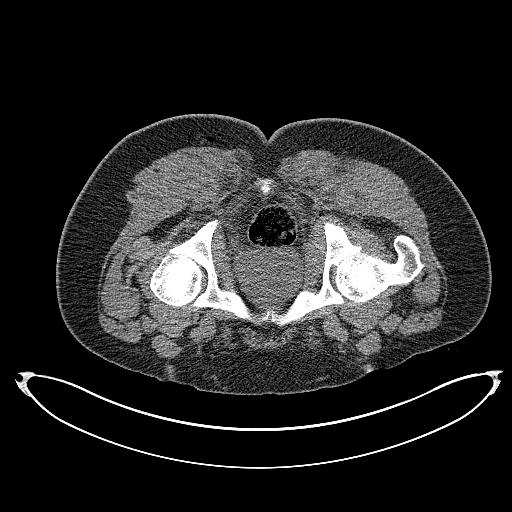
[im 16/36  soft-tissue]
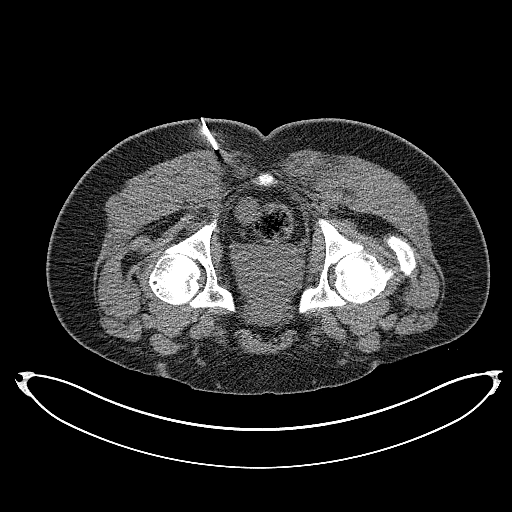
[im 21/36  soft-tissue]
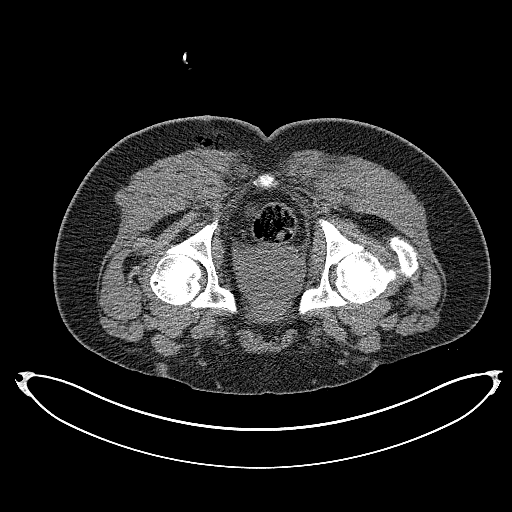
[im 26/36  soft-tissue]
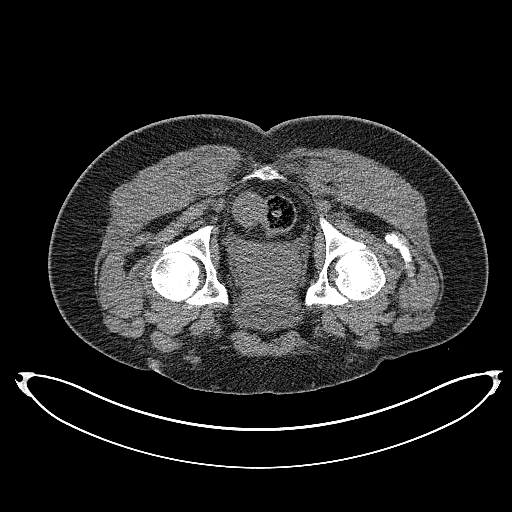
[im 31/36  soft-tissue]
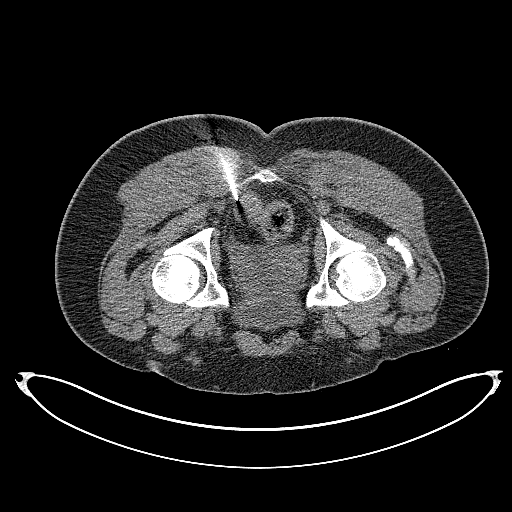

[12 of 16 positions shown; findings below may reference images not displayed]

EXAM:
CT BIOPSY

MEDICATIONS:
None.

ANESTHESIA/SEDATION:
Fentanyl 100 mcg IV; Versed 2 mg IV

Moderate Sedation Time:  13

The patient was continuously monitored during the procedure by the
interventional radiology nurse under my direct supervision.

FLUOROSCOPY TIME:  None

COMPLICATIONS:
None immediate.

PROCEDURE:
Informed written consent was obtained from the patient after a
thorough discussion of the procedural risks, benefits and
alternatives. All questions were addressed. Maximal Sterile Barrier
Technique was utilized including caps, mask, sterile gowns, sterile
gloves, sterile drape, hand hygiene and skin antiseptic. A timeout
was performed prior to the initiation of the procedure.

Under CT guidance, a(n) 17 gauge guide needle was advanced into the
left perirectal mass. Subsequently 3 18 gauge core biopsies were
obtained. Gel-Foam slurry was injected into the guide needle. The
guide needle was removed. Post biopsy images demonstrate no
hemorrhage.

Patient tolerated the procedure well without complication. Vital
sign monitoring by nursing staff during the procedure will continue
as patient is in the special procedures unit for post procedure
observation.
FINDINGS: The images document guide needle placement within the left
perirectal mass. Post biopsy images demonstrate no hemorrhage.
IMPRESSION: Successful CT-guided core biopsy of a left perirectal mass.

## 2018-06-20 ENCOUNTER — Telehealth: Payer: Self-pay | Admitting: *Deleted

## 2018-06-20 ENCOUNTER — Other Ambulatory Visit: Payer: Self-pay | Admitting: Oncology

## 2018-06-20 DIAGNOSIS — C61 Malignant neoplasm of prostate: Secondary | ICD-10-CM

## 2018-06-20 NOTE — Telephone Encounter (Signed)
Received notification that medication Kerry Dory needs prior authorization. Called [employer] and requested form to be faxed to office to be processed.  Pending fax.

## 2018-06-24 NOTE — Telephone Encounter (Signed)
Oral Oncology Pharmacist Encounter  Received request for [employer]. Patient no showed appointments in June and Sept 2019 and has not been seen by Dr. Alen Blew in over a year. Last labs March 2019.  Per MD, do not renew authorization at this time. He will be contacted for office follow-up prior to Zytiga being able to be renewed.  Johny Drilling, PharmD, BCPS, BCOP  06/24/2018 9:34 AM Oral Oncology Clinic [phone]

## 2018-06-28 ENCOUNTER — Telehealth: Payer: Self-pay

## 2018-06-28 NOTE — Telephone Encounter (Signed)
Received call from patient stating that he is unable to get the [facility] and wanted to follow up. Contacted patient and made aware that a follow up appointment needs to be scheduled prior to renewing Zytiga prescription per Dr. Alen Blew. Explained that he will get a call from the schedulers with his follow up appt date and time. Patient verbalized understanding and had no other questions or concerns.

## 2018-06-29 ENCOUNTER — Telehealth: Payer: Self-pay | Admitting: Oncology

## 2018-06-29 NOTE — Telephone Encounter (Signed)
Scheduled 5/6 appt per sch msg. Called and spoke with patient. Confirmed date and time

## 2018-07-01 MED FILL — predniSONE 5 MG TABS: 5 | 30 days supply | Qty: 30 | Fill #0

## 2018-07-06 ENCOUNTER — Other Ambulatory Visit: Payer: Self-pay

## 2018-07-06 ENCOUNTER — Inpatient Hospital Stay: Payer: 59 | Attending: Oncology | Admitting: Oncology

## 2018-07-06 ENCOUNTER — Telehealth: Payer: Self-pay

## 2018-07-06 ENCOUNTER — Telehealth: Payer: Self-pay | Admitting: Oncology

## 2018-07-06 ENCOUNTER — Inpatient Hospital Stay: Payer: 59

## 2018-07-06 VITALS — BP 158/92 | HR 100 | Temp 98.6°F | Resp 18 | Ht 66.0 in | Wt 217.8 lb

## 2018-07-06 DIAGNOSIS — C61 Malignant neoplasm of prostate: Secondary | ICD-10-CM

## 2018-07-06 DIAGNOSIS — Z79899 Other long term (current) drug therapy: Secondary | ICD-10-CM | POA: Insufficient documentation

## 2018-07-06 DIAGNOSIS — E291 Testicular hypofunction: Secondary | ICD-10-CM | POA: Diagnosis not present

## 2018-07-06 DIAGNOSIS — Z5111 Encounter for antineoplastic chemotherapy: Secondary | ICD-10-CM | POA: Diagnosis not present

## 2018-07-06 DIAGNOSIS — I10 Essential (primary) hypertension: Secondary | ICD-10-CM

## 2018-07-06 LAB — CBC WITH DIFFERENTIAL (CANCER CENTER ONLY)
Abs Immature Granulocytes: 0.03 10*3/uL (ref 0.00–0.07)
Basophils Absolute: 0 10*3/uL (ref 0.0–0.1)
Basophils Relative: 0 %
Eosinophils Absolute: 0 10*3/uL (ref 0.0–0.5)
Eosinophils Relative: 0 %
HCT: 38.4 % — ABNORMAL LOW (ref 39.0–52.0)
Hemoglobin: 13.3 g/dL (ref 13.0–17.0)
Immature Granulocytes: 0 %
Lymphocytes Relative: 13 %
Lymphs Abs: 1.3 10*3/uL (ref 0.7–4.0)
MCH: 32.8 pg (ref 26.0–34.0)
MCHC: 34.6 g/dL (ref 30.0–36.0)
MCV: 94.6 fL (ref 80.0–100.0)
Monocytes Absolute: 0.6 10*3/uL (ref 0.1–1.0)
Monocytes Relative: 6 %
Neutro Abs: 8.1 10*3/uL — ABNORMAL HIGH (ref 1.7–7.7)
Neutrophils Relative %: 81 %
Platelet Count: 334 10*3/uL (ref 150–400)
RBC: 4.06 MIL/uL — ABNORMAL LOW (ref 4.22–5.81)
RDW: 12.4 % (ref 11.5–15.5)
WBC Count: 10.1 10*3/uL (ref 4.0–10.5)
nRBC: 0 % (ref 0.0–0.2)

## 2018-07-06 LAB — CMP (CANCER CENTER ONLY)
ALT: 28 U/L (ref 0–44)
AST: 29 U/L (ref 15–41)
Albumin: 4.3 g/dL (ref 3.5–5.0)
Alkaline Phosphatase: 98 U/L (ref 38–126)
Anion gap: 10 (ref 5–15)
BUN: 20 mg/dL (ref 6–20)
CO2: 23 mmol/L (ref 22–32)
Calcium: 9.3 mg/dL (ref 8.9–10.3)
Chloride: 107 mmol/L (ref 98–111)
Creatinine: 0.96 mg/dL (ref 0.61–1.24)
GFR, Est AFR Am: >60 mL/min (ref >60)
GFR, Estimated: >60 mL/min (ref >60)
Glucose, Bld: 111 mg/dL — ABNORMAL HIGH (ref 70–99)
Potassium: 4.3 mmol/L (ref 3.5–5.1)
Sodium: 140 mmol/L (ref 135–145)
Total Bilirubin: 0.5 mg/dL (ref 0.3–1.2)
Total Protein: 8 g/dL (ref 6.5–8.1)

## 2018-07-06 MED ORDER — MEGESTROL ACETATE 20 MG PO TABS
20.0000 mg | ORAL_TABLET | Freq: Two times a day (BID) | ORAL | 3 refills | Status: DC
Start: 2018-07-06 — End: 2019-08-04

## 2018-07-06 MED ORDER — ZYTIGA 250 MG PO TABS: 1000.0000 mg | ORAL_TABLET | Freq: Every day | ORAL | 1 refills | Status: DC

## 2018-07-06 NOTE — Progress Notes (Signed)
Hematology and Oncology Follow Up Visit  Charles [name] [id number] 07-18-1960 58 y.o. 07/06/2018 2:29 PM Patient, No Pcp PerNo ref. 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  He presented with with pelvic mass and PSA of 698.    Prior Therapy: He is S/P left transgluteal pelvic mass biopsy on 08/28/2016.   Current therapy:  Lupron every 3 months given at [facility] urology.  This will be changed to Lupron 30 mg every 4 months starting May 2020.  Zytiga 1000 mg daily with prednisone at 5 mg daily started in September 2018.  Interim History: [name] returns today for a follow-up visit.  Since the last visit, he is currently on Zytiga without any major complications but has recently ran out.  He denies excessive fatigue, tiredness but does report some sexual dysfunction associated with androgen deprivation.  He continues to work full-time without inability to do so.  He denies any worsening edema or pelvic discomfort.  He denied any alteration mental status, neuropathy, confusion or dizziness.  Denies any headaches or lethargy.  Denies any night sweats, weight loss or changes in appetite.  Denied orthopnea, dyspnea on exertion or chest discomfort.  Denies shortness of breath, difficulty breathing hemoptysis or cough.  Denies any abdominal distention, nausea, early satiety or dyspepsia.  Denies any hematuria, frequency, dysuria or nocturia.  Denies any skin irritation, dryness or rash.  Denies any ecchymosis or petechiae.  Denies any lymphadenopathy or clotting.  Denies any heat or cold intolerance.  Denies any anxiety or depression.  Remaining review of system is negative.    Medications: I have reviewed the patient's current medications.  Current Outpatient Medications  Medication Sig Dispense Refill  . megestrol (MEGACE) 20 MG tablet Take 1 tablet (20 mg total) by mouth 2 (two) times daily. 90 tablet 3  .  predniSONE (DELTASONE) 5 MG tablet TAKE 1 TABLET (5 MG TOTAL) BY MOUTH DAILY WITH BREAKFAST. 90 tablet 3  . ZYTIGA 250 MG tablet TAKE 4 TABLETS (1,000MG ) BY MOUTH ONCE DAILY ON AN  EMPTY STOMACH 1 HOUR BEFORE OR 2 HOURS AFTER A MEAL 120 tablet 0   No current facility-administered medications for this visit.      Allergies: No Known Allergies  Past Medical History, Surgical history, Social history, and Family History unchanged from previous examination.  Marland Kitchen Physical Exam: Blood pressure (!) 158/92, pulse 100, temperature 98.6 F (37 C), temperature source Oral, resp. rate 18, height 5\' 6"  (1.676 m), weight 217 lb 12.8 oz (98.8 kg), SpO2 99 %.   ECOG: 0   General appearance: Comfortable appearing without any discomfort Head: Normocephalic without any trauma Oropharynx: Mucous membranes are moist and pink without any thrush or ulcers. Eyes: Pupils are equal and round reactive to light. Lymph nodes: No cervical, supraclavicular, inguinal or axillary lymphadenopathy.   Heart:regular rate and rhythm.  S1 and S2 without leg edema. Lung: Clear without any rhonchi or wheezes.  No dullness to percussion. Abdomin: Soft, nontender, nondistended with good bowel sounds.  No hepatosplenomegaly. Musculoskeletal: No joint deformity or effusion.  Full range of motion noted. Neurological: No deficits noted on motor, sensory and deep tendon reflex exam. Skin: No petechial rash or dryness.  Appeared moist.     Lab Results: Lab Results  Component Value Date   WBC 10.1 07/06/2018   HGB 13.3 07/06/2018   HCT 38.4 (L) 07/06/2018   MCV 94.6 07/06/2018   PLT 334 07/06/2018  Chemistry      Component Value Date/Time   NA 141 05/11/2017 1335   NA 144 03/03/2017 1253   K 4.2 05/11/2017 1335   K 3.6 03/03/2017 1253   CL 107 05/11/2017 1335   CO2 26 05/11/2017 1335   CO2 23 03/03/2017 1253   BUN 13 05/11/2017 1335   BUN 13.4 03/03/2017 1253   CREATININE 0.99 05/11/2017 1335   CREATININE 0.9  03/03/2017 1253      Component Value Date/Time   CALCIUM 9.9 05/11/2017 1335   CALCIUM 9.3 03/03/2017 1253   ALKPHOS 70 05/11/2017 1335   ALKPHOS 66 03/03/2017 1253   AST 14 05/11/2017 1335   AST 15 03/03/2017 1253   ALT 13 05/11/2017 1335   ALT 17 03/03/2017 1253   BILITOT 0.7 05/11/2017 1335   BILITOT 0.87 03/03/2017 1253     Results for ADONTE, [name] (MRN [id number]) as of 05/11/2017 13:38  Ref. Range 12/30/2016 10:24 03/03/2017 12:53  Prostate Specific Ag, Serum Latest Ref Range: 0.0 - 4.0 ng/mL 13.3 (H) 2.4    Impression and Plan:  58 year old with the following issues:  1.  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    He has been on Zytiga without any major complications and has missed a few days as he ran out of the medication recently.  Risks and benefits of continuing this therapy long-term was discussed today and is agreeable to continue.  Complications that include hypertension, fluid retention and edema were reviewed.  Is agreeable to continue at this time.   2. Androgen depravation: He has been receiving androgen deprivation at [facility] urology but has not received it as of late.  I will resume androgen deprivation indefinitely with 30 mg of Lupron every 4 months.  Complications including sexual dysfunction, osteoporosis and hyperlipidemia.  3.  Hypokalemia: His potassium will be rechecked periodically.  Has been within normal range.  4.  Liver function surveillance: No issues reported with his liver function test and will be repeated today..  5.  Hypertension: His blood pressure is mildly elevated today but has been within normal range outside of his visits.  We will continue to monitor his counts periodically.  6. Goals of care: His disease is incurable but aggressive therapy is warranted at this time given his young age and aggressive disease.  7.  Follow-up: will be in 2 months to follow his progress.  25  minutes  was spent with the patient face-to-face today.  More than 50% of time was spent on reviewing his disease status, treatment options and answering questions regarding future care.    Zola Button, MD 5/6/20202:29 PM

## 2018-07-06 NOTE — Addendum Note (Signed)
Addended by: Wyatt Portela on: 07/06/2018 02:39 PM   Modules accepted: Orders

## 2018-07-06 NOTE — Telephone Encounter (Signed)
Scheduled appt per sch msg. Called and spoke with patient. Confirmed date and time °

## 2018-07-06 NOTE — Telephone Encounter (Signed)
Oral Oncology Patient Advocate Encounter  Received notification from [employer] that the existing prior authorization for Zytiga is due for renewal.  Renewal PA submitted on CoverMyMeds Key [id number] Status is pending  Oral Oncology Clinic will continue to follow.  [place] Patient [place] Phone [phone] Fax [phone] 07/06/2018    3:10 PM

## 2018-07-07 NOTE — Telephone Encounter (Signed)
Oral Oncology Pharmacist Encounter  I called [facility] specialty pharmacy at [phone] to alert dispensing pharmacy of approved insurance authorization for Zytiga.  New prescription for Zytiga 250 mg tablets, take 4 tablets (1000 mg) by mouth once daily on an empty stomach, 1 hour before or 2 hours after food, quantity #120, refills = 1, was E scribed to dispensing pharmacy yesterday, 07/06/2018.  Representative was able to process claim.  I called patient to let him know that he can contact dispensing pharmacy to schedule shipment of his Zytiga whenever he is ready. I provided above contact phone number for dispensing pharmacy.  All questions answered. Patient expressed understanding and appreciation. Patient knows to call the office with any additional questions or concerns.  Johny Drilling, PharmD, BCPS, BCOP  07/07/2018 1:37 PM Oral Oncology Clinic [phone]

## 2018-07-07 NOTE — Telephone Encounter (Signed)
Oral Oncology Patient Advocate Encounter  Prior Authorization for Fabio Asa has been approved.    PA# [id number] Effective dates: 07/07/18 through 07/06/19  Oral Oncology Clinic will continue to follow.   [place] Patient [place] Phone [phone] Fax [phone] 07/07/2018    1:08 PM

## 2018-07-13 ENCOUNTER — Ambulatory Visit: Payer: 59

## 2018-07-13 ENCOUNTER — Other Ambulatory Visit: Payer: Self-pay

## 2018-07-13 ENCOUNTER — Inpatient Hospital Stay: Payer: 59

## 2018-07-13 DIAGNOSIS — C61 Malignant neoplasm of prostate: Secondary | ICD-10-CM

## 2018-07-13 DIAGNOSIS — Z5111 Encounter for antineoplastic chemotherapy: Secondary | ICD-10-CM | POA: Diagnosis not present

## 2018-07-13 MED ORDER — LEUPROLIDE ACETATE (4 MONTH) 30 MG IM KIT
30.0000 mg | PACK | Freq: Once | INTRAMUSCULAR | Status: AC
  Administered 2018-07-13: 30 mg via INTRAMUSCULAR
  Filled 2018-07-13: qty 30

## 2018-07-13 NOTE — Patient Instructions (Signed)
Leuprolide depot injection  What is this medicine?  LEUPROLIDE (loo PROE lide) is a man-made protein that acts like a natural hormone in the body. It decreases testosterone in men and decreases estrogen in women. In men, this medicine is used to treat advanced prostate cancer. In women, some forms of this medicine may be used to treat endometriosis, uterine fibroids, or other male hormone-related problems.  This medicine may be used for other purposes; ask your health care provider or pharmacist if you have questions.  COMMON BRAND NAME(S): Eligard, Lupron Depot, Lupron Depot-Ped, Viadur  What should I tell my health care provider before I take this medicine?  They need to know if you have any of these conditions:  -diabetes  -heart disease or previous heart attack  -high blood pressure  -high cholesterol  -mental illness  -osteoporosis  -pain or difficulty passing urine  -seizures  -spinal cord metastasis  -stroke  -suicidal thoughts, plans, or attempt; a previous suicide attempt by you or a family member  -tobacco smoker  -unusual vaginal bleeding (women)  -an unusual or allergic reaction to leuprolide, benzyl alcohol, other medicines, foods, dyes, or preservatives  -pregnant or trying to get pregnant  -breast-feeding  How should I use this medicine?  This medicine is for injection into a muscle or for injection under the skin. It is given by a health care professional in a hospital or clinic setting. The specific product will determine how it will be given to you. Make sure you understand which product you receive and how often you will receive it.  Talk to your pediatrician regarding the use of this medicine in children. Special care may be needed.  Overdosage: If you think you have taken too much of this medicine contact a poison control center or emergency room at once.  NOTE: This medicine is only for you. Do not share this medicine with others.  What if I miss a dose?  It is important not to miss a dose.  Call your doctor or health care professional if you are unable to keep an appointment.  Depot injections: Depot injections are given either once-monthly, every 12 weeks, every 16 weeks, or every 24 weeks depending on the product you are prescribed. The product you are prescribed will be based on if you are male or male, and your condition. Make sure you understand your product and dosing.  What may interact with this medicine?  Do not take this medicine with any of the following medications:  -chasteberry  This medicine may also interact with the following medications:  -herbal or dietary supplements, like black cohosh or DHEA  -male hormones, like estrogens or progestins and birth control pills, patches, rings, or injections  -male hormones, like testosterone  This list may not describe all possible interactions. Give your health care provider a list of all the medicines, herbs, non-prescription drugs, or dietary supplements you use. Also tell them if you smoke, drink alcohol, or use illegal drugs. Some items may interact with your medicine.  What should I watch for while using this medicine?  Visit your doctor or health care professional for regular checks on your progress. During the first weeks of treatment, your symptoms may get worse, but then will improve as you continue your treatment. You may get hot flashes, increased bone pain, increased difficulty passing urine, or an aggravation of nerve symptoms. Discuss these effects with your doctor or health care professional, some of them may improve with continued use of this   medicine.  Male patients may experience a menstrual cycle or spotting during the first months of therapy with this medicine. If this continues, contact your doctor or health care professional.  What side effects may I notice from receiving this medicine?  Side effects that you should report to your doctor or health care professional as soon as possible:  -allergic reactions like skin  rash, itching or hives, swelling of the face, lips, or tongue  -breathing problems  -chest pain  -depression or memory disorders  -pain in your legs or groin  -pain at site where injected or implanted  -seizures  -severe headache  -swelling of the feet and legs  -suicidal thoughts or other mood changes  -visual changes  -vomiting  Side effects that usually do not require medical attention (report to your doctor or health care professional if they continue or are bothersome):  -breast swelling or tenderness  -decrease in sex drive or performance  -diarrhea  -hot flashes  -loss of appetite  -muscle, joint, or bone pains  -nausea  -redness or irritation at site where injected or implanted  -skin problems or acne  This list may not describe all possible side effects. Call your doctor for medical advice about side effects. You may report side effects to FDA at 1-800-FDA-1088.  Where should I keep my medicine?  This drug is given in a hospital or clinic and will not be stored at home.  NOTE: This sheet is a summary. It may not cover all possible information. If you have questions about this medicine, talk to your doctor, pharmacist, or health care provider.   2019 Elsevier/Gold Standard (2015-08-01 09:45:53)

## 2018-07-29 ENCOUNTER — Telehealth: Payer: Self-pay

## 2018-07-29 NOTE — Telephone Encounter (Signed)
Patient called and left a voicemail asking for a return phone call. Returned phone call and got patient's voicemail. Left a message for patient to return call to [phone].

## 2018-08-01 ENCOUNTER — Telehealth: Payer: Self-pay

## 2018-08-01 NOTE — Telephone Encounter (Signed)
Received call from patient stating that he has not received the Zytiga yet. He also stated that the area in which he received the injection is bruised. Provided the patient with the contact number from [facility] to follow up and arrange delivery. Also explained that redness and soreness at the injection site can occur and to keep an eye on the area and if anything worsens to call back. Patient verbalized understanding and had no other questions or concerns.

## 2018-08-04 ENCOUNTER — Telehealth: Payer: Self-pay

## 2018-08-04 NOTE — Telephone Encounter (Signed)
Received call from patient stating that he is concerned about his Lupron injection site from 5/13 and would like for someone to look at it. After speaking with [facility] called patient back and explained that the scheduler will call him and let him know of his appointment time for tomorrow to be seen in the [facility]. Patient verbalized understanding. Scheduling message sent to arrange appointment for patient to be seen in [facility] on 6/5 at or after 2pm.

## 2018-08-05 ENCOUNTER — Inpatient Hospital Stay: Payer: 59 | Admitting: Medical

## 2018-08-05 ENCOUNTER — Telehealth: Payer: Self-pay | Admitting: Emergency Medicine

## 2018-08-05 NOTE — Telephone Encounter (Signed)
Called pt to confirm [facility] appt today.  Pt states he meant to call and cancel his appt today.  States he saw his Urologist Dr. Alyson Ingles yesterday, and that while he was there the MD assessed the area of concern after his Lupron shot.  Per pt Dr. Alyson Ingles said it looked a bit infected and wrote a prescription for oral antibiotics.  Pt states he feels comfortable continuing off of Dr. Noland Fordyce treatment plan and will call back next week if there is no improvement or if he has any further questions or concerns.  PA Lucianne Lei made aware, MD Shadad's desk RN Seth Bake made aware.  [facility] appt for today (08/05/2018) cancelled.  Pt VU of next appt date/time with MD Shadad.

## 2018-08-19 ENCOUNTER — Telehealth: Payer: Self-pay

## 2018-08-19 NOTE — Telephone Encounter (Signed)
Received a call from the patient stating that he has not received his Zytiga for this month and cannot locate the 800# to contact the pharmacy. Patient stated that he spoke with a pharmacy rep last week and paid his $10 copay but has still not received the medication. Provided the contact # for [name] and told patient to call the office back if he has any further problems receiving his Zytiga.

## 2018-08-26 ENCOUNTER — Telehealth: Payer: Self-pay

## 2018-08-26 ENCOUNTER — Inpatient Hospital Stay: Payer: 59 | Attending: Oncology | Admitting: Medical

## 2018-08-26 ENCOUNTER — Other Ambulatory Visit: Payer: Self-pay

## 2018-08-26 VITALS — BP 142/92 | HR 94 | Temp 99.4°F | Resp 18 | Ht 66.0 in | Wt 213.3 lb

## 2018-08-26 DIAGNOSIS — L0291 Cutaneous abscess, unspecified: Secondary | ICD-10-CM

## 2018-08-26 DIAGNOSIS — C61 Malignant neoplasm of prostate: Secondary | ICD-10-CM | POA: Insufficient documentation

## 2018-08-26 DIAGNOSIS — I129 Hypertensive chronic kidney disease with stage 1 through stage 4 chronic kidney disease, or unspecified chronic kidney disease: Secondary | ICD-10-CM | POA: Diagnosis not present

## 2018-08-26 DIAGNOSIS — N189 Chronic kidney disease, unspecified: Secondary | ICD-10-CM

## 2018-08-26 DIAGNOSIS — L0231 Cutaneous abscess of buttock: Secondary | ICD-10-CM

## 2018-08-26 MED ORDER — MUPIROCIN CALCIUM 2 % EX CREA: TOPICAL_CREAM | CUTANEOUS | 1 refills | Status: DC

## 2018-08-26 MED ORDER — SULFAMETHOXAZOLE-TRIMETHOPRIM 800-160 MG PO TABS: 1.0000 | ORAL_TABLET | Freq: Two times a day (BID) | ORAL | 0 refills | Status: DC

## 2018-08-26 MED FILL — SULFAMETHOXAZOLE-TMP DS TAB: 800-160 | 7 days supply | Qty: 14 | Fill #0

## 2018-08-26 NOTE — Telephone Encounter (Signed)
Patient called and stated he received a Lupron injection in May and his left hip is still swollen and sore from the injection. Patient stated he finished a course of antibiotics prescribed by Dr. Alyson Ingles but does not see any improvement. Sandi Mealy, PA to see patient at 2:30 today in Symptom Management Clinic. Patient aware of appointment time.

## 2018-08-29 ENCOUNTER — Telehealth: Payer: Self-pay | Admitting: Medical

## 2018-08-29 NOTE — Progress Notes (Signed)
Symptoms Management Clinic Progress Note   DEARIS Charles [name] [id number] August 10, 1960 58 y.o.  Charles [name] is managed by Dr. Alen Blew  Actively treated with chemotherapy/immunotherapy/hormonal therapy: yes  Current therapy: Lupron  Last treated: 07/13/2018 (treatment 1)  Next scheduled appointment with provider: 09/14/2018  Assessment: Plan:    Abscess of the left buttock: The patient was given a prescription for Bactroban and Bactrim DS 1 p.o. twice daily x7 days.  Malignant neoplasm of the prostate: The patient is managed by Dr. Alen Blew is status post his first treatment with Lupron which was dosed on 07/13/2018.  Please see After Visit Summary for patient specific instructions.  Future Appointments  Date Time Provider [place]  09/14/2018  1:15 PM [facility] LAB 3 [facility] None  09/14/2018  1:45 PM Shadad, Charles Dad, MD [facility] None    No orders of the defined types were placed in this encounter.      Subjective:   Patient ID:  Charles [name] is a 58 y.o. (DOB 1960/06/05) male.  Chief Complaint: No chief complaint on file.   HPI [name]   is a 58 year old male with a diagnosis of a malignant neoplasm of the prostate who is managed by Dr. Zola Button and he is status post his first treatment with Lupron which was dosed on 07/13/2018.  The patient reports that he developed an abscess at the site of the lung following injection and is left buttock.  He was seen by his urologist and was treated with antibiotics.  Despite this he continues to have purulent discharge from the site along with pain and induration.  He denies fevers, chills, or sweats.  Medications: I have reviewed the patient's current medications.  Allergies: No Known Allergies  Past Medical History:  Diagnosis Date  . Blurred vision   . Chronic kidney disease    renal cyst  . Diverticulosis   . Hemorrhoids   . Hypertension   . Prostate cancer ([place])   .  Rectal bleeding     Past Surgical History:  Procedure Laterality Date  . CIRCUMCISION    . needle core biopsy      Family History  Problem Relation Age of Onset  . Colon cancer Neg Hx   . Esophageal cancer Neg Hx   . Rectal cancer Neg Hx   . Stomach cancer Neg Hx   . Cancer Neg Hx     Social History   Socioeconomic History  . Marital status: Single    Spouse name: Not on file  . Number of children: Not on file  . Years of education: Not on file  . Highest education level: Not on file  Occupational History  . Not on file  Social Needs  . Financial resource strain: Not on file  . Food insecurity    Worry: Not on file    Inability: Not on file  . Transportation needs    Medical: Not on file    Non-medical: Not on file  Tobacco Use  . Smoking status: Never Smoker  . Smokeless tobacco: Never Used  Substance and Sexual Activity  . Alcohol use: Yes    Alcohol/week: 2.0 standard drinks    Types: 2 Cans of beer per week    Comment: daily use /2 24ouce cans  . Drug use: No  . Sexual activity: Yes  Lifestyle  . Physical activity    Days per week: Not on file    Minutes per session: Not on file  .  Stress: Not on file  Relationships  . Social Herbalist on phone: Not on file    Gets together: Not on file    Attends religious service: Not on file    Active member of club or organization: Not on file    Attends meetings of clubs or organizations: Not on file    Relationship status: Not on file  . Intimate partner violence    Fear of current or ex partner: Not on file    Emotionally abused: Not on file    Physically abused: Not on file    Forced sexual activity: Not on file  Other Topics Concern  . Not on file  Social History Narrative  . Not on file    Past Medical History, Surgical history, Social history, and Family history were reviewed and updated as appropriate.   Please see review of systems for further details on the patient's review from  today.   Review of Systems:  Review of Systems  Constitutional: Negative for chills, diaphoresis and fever.  HENT: Negative for trouble swallowing and voice change.   Respiratory: Negative for cough, chest tightness, shortness of breath and wheezing.   Cardiovascular: Negative for chest pain and palpitations.  Gastrointestinal: Negative for abdominal pain, constipation, diarrhea, nausea and vomiting.  Musculoskeletal: Negative for back pain and myalgias.  Skin: Positive for wound.       Induration, tenderness, and purulent discharge from an injection site in the left buttock.  Neurological: Negative for dizziness, light-headedness and headaches.    Objective:   Physical Exam:  BP (!) 142/92 (BP Location: Left Arm, Patient Position: Sitting) Comment: Notified Nurse of Bp  Pulse 94   Temp 99.4 F (37.4 C) (Temporal)   Resp 18   Ht 5\' 6"  (1.676 m)   Wt 213 lb 4.8 oz (96.8 kg)   SpO2 98%   BMI 34.43 kg/m  ECOG: 0  Physical Exam Constitutional:      General: He is not in acute distress.    Appearance: Normal appearance. He is not ill-appearing.  HENT:     Head: Normocephalic and atraumatic.  Skin:    General: Skin is warm and dry.     Findings: Erythema present.     Comments: The patient has a 2.5 x 2.5 cm area of induration in the left buttock.  The area is erythematous.  A scant amount of purulent material is expressed from a central opening in the lesion.  Neurological:     General: No focal deficit present.     Mental Status: He is alert.     Coordination: Coordination normal.     Gait: Gait normal.  Psychiatric:        Mood and Affect: Mood normal.        Behavior: Behavior normal.        Thought Content: Thought content normal.        Judgment: Judgment normal.     Lab Review:     Component Value Date/Time   NA 140 07/06/2018 1340   NA 144 03/03/2017 1253   K 4.3 07/06/2018 1340   K 3.6 03/03/2017 1253   CL 107 07/06/2018 1340   CO2 23 07/06/2018 1340    CO2 23 03/03/2017 1253   GLUCOSE 111 (H) 07/06/2018 1340   GLUCOSE 122 03/03/2017 1253   BUN 20 07/06/2018 1340   BUN 13.4 03/03/2017 1253   CREATININE 0.96 07/06/2018 1340   CREATININE 0.9 03/03/2017 1253  CALCIUM 9.3 07/06/2018 1340   CALCIUM 9.3 03/03/2017 1253   PROT 8.0 07/06/2018 1340   PROT 7.5 03/03/2017 1253   ALBUMIN 4.3 07/06/2018 1340   ALBUMIN 4.3 03/03/2017 1253   AST 29 07/06/2018 1340   AST 15 03/03/2017 1253   ALT 28 07/06/2018 1340   ALT 17 03/03/2017 1253   ALKPHOS 98 07/06/2018 1340   ALKPHOS 66 03/03/2017 1253   BILITOT 0.5 07/06/2018 1340   BILITOT 0.87 03/03/2017 1253   GFRNONAA >60 07/06/2018 1340   GFRAA >60 07/06/2018 1340       Component Value Date/Time   WBC 10.1 07/06/2018 1340   WBC 9.9 05/11/2017 1335   RBC 4.06 (L) 07/06/2018 1340   HGB 13.3 07/06/2018 1340   HGB 12.4 (L) 03/03/2017 1253   HCT 38.4 (L) 07/06/2018 1340   HCT 36.2 (L) 03/03/2017 1253   PLT 334 07/06/2018 1340   PLT 294 03/03/2017 1253   MCV 94.6 07/06/2018 1340   MCV 96.3 03/03/2017 1253   MCH 32.8 07/06/2018 1340   MCHC 34.6 07/06/2018 1340   RDW 12.4 07/06/2018 1340   RDW 13.2 03/03/2017 1253   LYMPHSABS 1.3 07/06/2018 1340   LYMPHSABS 1.2 03/03/2017 1253   MONOABS 0.6 07/06/2018 1340   MONOABS 0.5 03/03/2017 1253   EOSABS 0.0 07/06/2018 1340   EOSABS 0.0 03/03/2017 1253   BASOSABS 0.0 07/06/2018 1340   BASOSABS 0.1 03/03/2017 1253   -------------------------------  Imaging from last 24 hours (if applicable):  Radiology interpretation: No results found.

## 2018-08-29 NOTE — Telephone Encounter (Signed)
No los per 6/26. °

## 2018-08-30 ENCOUNTER — Other Ambulatory Visit: Payer: Self-pay | Admitting: *Deleted

## 2018-08-30 DIAGNOSIS — L0291 Cutaneous abscess, unspecified: Secondary | ICD-10-CM

## 2018-08-30 MED ORDER — MUPIROCIN 2 % EX OINT: TOPICAL_OINTMENT | CUTANEOUS | 1 refills | Status: DC

## 2018-08-30 MED FILL — MUPIROCIN 2% OINTMENT: 2 | 7 days supply | Qty: 22 | Fill #0

## 2018-09-14 ENCOUNTER — Inpatient Hospital Stay: Payer: 59 | Admitting: Oncology

## 2018-09-14 ENCOUNTER — Inpatient Hospital Stay: Payer: 59 | Attending: Oncology

## 2018-09-14 ENCOUNTER — Ambulatory Visit: Payer: 59 | Admitting: Oncology

## 2018-09-14 ENCOUNTER — Other Ambulatory Visit: Payer: 59

## 2018-12-20 ENCOUNTER — Telehealth: Payer: Self-pay

## 2018-12-20 NOTE — Telephone Encounter (Signed)
Sandi Mealy, PA notified. Patient states he is unable to come in today, but would like to come in on 12/21/18 in the afternoon. Scheduling message sent.

## 2018-12-20 NOTE — Telephone Encounter (Signed)
-----   Message from Wyatt Portela, MD sent at 12/20/2018 10:29 AM EDT ----- Regarding: RE: Patient advice request [facility] please. Thanks ----- Message ----- From: Teodoro Spray, RN Sent: 12/20/2018  10:19 AM EDT To: Wyatt Portela, MD Subject: Patient advice request                         Patient called office stating "the place I got my injection months ago still hasn't healed". Patient received Lupron injection on 07/13/18, and was seen by Sandi Mealy, PA on 08/26/18 for abscess at site of injection.  Patient was prescribed bactoban cream and bactrim PO for 7 days. Patient states area has decreased in size and he has to keep a bandaid on it "because it sometimes leaks fluid".  Please advise.

## 2018-12-21 ENCOUNTER — Other Ambulatory Visit: Payer: Self-pay

## 2018-12-21 ENCOUNTER — Inpatient Hospital Stay: Payer: 59 | Attending: Medical | Admitting: Medical

## 2018-12-21 VITALS — BP 140/89 | HR 113 | Temp 98.7°F | Resp 17 | Ht 66.0 in | Wt 210.2 lb

## 2018-12-21 DIAGNOSIS — L0231 Cutaneous abscess of buttock: Secondary | ICD-10-CM

## 2018-12-21 DIAGNOSIS — N189 Chronic kidney disease, unspecified: Secondary | ICD-10-CM | POA: Diagnosis not present

## 2018-12-21 DIAGNOSIS — C61 Malignant neoplasm of prostate: Secondary | ICD-10-CM | POA: Diagnosis present

## 2018-12-21 DIAGNOSIS — I129 Hypertensive chronic kidney disease with stage 1 through stage 4 chronic kidney disease, or unspecified chronic kidney disease: Secondary | ICD-10-CM | POA: Diagnosis not present

## 2018-12-21 DIAGNOSIS — Z79899 Other long term (current) drug therapy: Secondary | ICD-10-CM | POA: Diagnosis not present

## 2018-12-21 NOTE — Patient Instructions (Signed)
COVID-19: How to Protect Yourself and Others Know how it spreads  There is currently no vaccine to prevent coronavirus disease 2019 (COVID-19).  The best way to prevent illness is to avoid being exposed to this virus.  The virus is thought to spread mainly from person-to-person. ? Between people who are in close contact with one another (within about 6 feet). ? Through respiratory droplets produced when an infected person coughs, sneezes or talks. ? These droplets can land in the mouths or noses of people who are nearby or possibly be inhaled into the lungs. ? Some recent studies have suggested that COVID-19 may be spread by people who are not showing symptoms. Everyone should Clean your hands often  Wash your hands often with soap and water for at least 20 seconds especially after you have been in a public place, or after blowing your nose, coughing, or sneezing.  If soap and water are not readily available, use a hand sanitizer that contains at least 60% alcohol. Cover all surfaces of your hands and rub them together until they feel dry.  Avoid touching your eyes, nose, and mouth with unwashed hands. Avoid close contact  Stay home if you are sick.  Avoid close contact with people who are sick.  Put distance between yourself and other people. ? Remember that some people without symptoms may be able to spread virus. ? This is especially important for people who are at higher risk of getting very sick.www.cdc.gov/coronavirus/2019-ncov/need-extra-precautions/people-at-higher-risk.html Cover your mouth and nose with a cloth face cover when around others  You could spread COVID-19 to others even if you do not feel sick.  Everyone should wear a cloth face cover when they have to go out in public, for example to the grocery store or to pick up other necessities. ? Cloth face coverings should not be placed on young children under age 2, anyone who has trouble breathing, or is unconscious,  incapacitated or otherwise unable to remove the mask without assistance.  The cloth face cover is meant to protect other people in case you are infected.  Do NOT use a facemask meant for a healthcare worker.  Continue to keep about 6 feet between yourself and others. The cloth face cover is not a substitute for social distancing. Cover coughs and sneezes  If you are in a private setting and do not have on your cloth face covering, remember to always cover your mouth and nose with a tissue when you cough or sneeze or use the inside of your elbow.  Throw used tissues in the trash.  Immediately wash your hands with soap and water for at least 20 seconds. If soap and water are not readily available, clean your hands with a hand sanitizer that contains at least 60% alcohol. Clean and disinfect  Clean AND disinfect frequently touched surfaces daily. This includes tables, doorknobs, light switches, countertops, handles, desks, phones, keyboards, toilets, faucets, and sinks. www.cdc.gov/coronavirus/2019-ncov/prevent-getting-sick/disinfecting-your-home.html  If surfaces are dirty, clean them: Use detergent or soap and water prior to disinfection.  Then, use a household disinfectant. You can see a list of EPA-registered household disinfectants here. cdc.gov/coronavirus 07/05/2018 This information is not intended to replace advice given to you by your health care provider. Make sure you discuss any questions you have with your health care provider. Document Released: 06/14/2018 Document Revised: 07/13/2018 Document Reviewed: 06/14/2018 Elsevier Patient Education  2020 Elsevier Inc.  

## 2018-12-21 NOTE — Progress Notes (Signed)
Symptoms Management Clinic Progress Note   Charles [name] [id number] [birth year] 58 y.o.  [name] is managed by Dr. Alen Blew  Actively treated with chemotherapy/immunotherapy/hormonal therapy: yes  Current therapy: Lupron  Last treated: 07/13/2018 (treatment 1)  Next scheduled appointment with provider: 12/21/2018  Assessment: Plan:    Malignant neoplasm of prostate ([place])  Abscess of buttock, left - Plan: Culture, Aerobic, Ambulatory referral to General Surgery   Prostate cancer: The patient continues to be managed by Dr. Alen Blew.  He is status post dosing with Lupron on 07/13/2018.  He is scheduled to be seen in return on 12/21/2018.  Abscess left buttock: The patient continues to have an open lesion in the left buttock at the site of a previous Lupron injection on 07/13/2018.  He was last seen for this on 08/26/2018 and was treated with Bactroban and Bactrim DS for 7 days.  Despite this the area did not heal.  A culture was collected today and the patient was referred to general surgery for evaluation to determine if the area needs to have an I&D or further exploration.  Please see After Visit Summary for patient specific instructions.  No future appointments.  Orders Placed This Encounter  Procedures   Culture, Aerobic   Aerobic Culture (superficial specimen)   Ambulatory referral to General Surgery       Subjective:   Patient ID:  Charles [name] is a 58 y.o. (DOB 1960/12/25) male.  Chief Complaint:  Chief Complaint  Patient presents with   Recurrent Skin Infections    HPI Charles [name]  is a 58 year old male with a diagnosis of a malignant neoplasm of the prostate who is managed by Dr. Zola Button and he is status post his first treatment with Lupron which was dosed on 07/13/2018. He continues to have an open lesion in the left buttock at the site of a previous Lupron injection on 07/13/2018.  He was last seen for this on  08/26/2018 and was treated with Bactroban and Bactrim DS for 7 days.  Despite this the area did not heal.  He reports that he continues to have greenish discharge from the area which is without odor. He denies fevers, chills, sweats, nausea, vomiting, diarrhea, chest pain, or shortness of breath..  Medications: I have reviewed the patient's current medications.  Allergies: No Known Allergies  Past Medical History:  Diagnosis Date   Blurred vision    Chronic kidney disease    renal cyst   Diverticulosis    Hemorrhoids    Hypertension    Prostate cancer ([place])    Rectal bleeding     Past Surgical History:  Procedure Laterality Date   CIRCUMCISION     needle core biopsy      Family History  Problem Relation Age of Onset   Colon cancer Neg Hx    Esophageal cancer Neg Hx    Rectal cancer Neg Hx    Stomach cancer Neg Hx    Cancer Neg Hx     Social History   Socioeconomic History   Marital status: Single    Spouse name: Not on file   Number of children: Not on file   Years of education: Not on file   Highest education level: Not on file  Occupational History   Not on file  Social Needs   Financial resource strain: Not on file   Food insecurity    Worry: Not on file    Inability: Not on file  Transportation needs    Medical: Not on file    Non-medical: Not on file  Tobacco Use   Smoking status: Never Smoker   Smokeless tobacco: Never Used  Substance and Sexual Activity   Alcohol use: Yes    Alcohol/week: 2.0 standard drinks    Types: 2 Cans of beer per week    Comment: daily use /2 24ouce cans   Drug use: No   Sexual activity: Yes  Lifestyle   Physical activity    Days per week: Not on file    Minutes per session: Not on file   Stress: Not on file  Relationships   Social connections    Talks on phone: Not on file    Gets together: Not on file    Attends religious service: Not on file    Active member of club or organization:  Not on file    Attends meetings of clubs or organizations: Not on file    Relationship status: Not on file   Intimate partner violence    Fear of current or ex partner: Not on file    Emotionally abused: Not on file    Physically abused: Not on file    Forced sexual activity: Not on file  Other Topics Concern   Not on file  Social History Narrative   Not on file    Past Medical History, Surgical history, Social history, and Family history were reviewed and updated as appropriate.   Please see review of systems for further details on the patient's review from today.   Review of Systems:  Review of Systems  Constitutional: Negative for chills, diaphoresis and fever.  HENT: Negative for facial swelling and trouble swallowing.   Respiratory: Negative for cough, chest tightness and shortness of breath.   Cardiovascular: Negative for chest pain.  Skin: Positive for wound (Open draining wound in the left buttock). Negative for rash.    Objective:   Physical Exam:  BP 140/89 (BP Location: Left Arm, Patient Position: Sitting)    Pulse (!) 113    Temp 98.7 F (37.1 C) (Temporal)    Resp 17    Ht 5\' 6"  (1.676 m)    Wt 210 lb 3.2 oz (95.3 kg)    SpO2 97%    BMI 33.93 kg/m  ECOG: 0  Physical Exam Constitutional:      General: He is not in acute distress.    Appearance: Normal appearance. He is not ill-appearing, toxic-appearing or diaphoretic.  HENT:     Head: Normocephalic and atraumatic.  Skin:    General: Skin is warm and dry.       Neurological:     Mental Status: He is alert.     Coordination: Coordination normal.     Gait: Gait normal.  Psychiatric:        Mood and Affect: Mood normal.        Behavior: Behavior normal.        Thought Content: Thought content normal.        Judgment: Judgment normal.     Lab Review:     Component Value Date/Time   NA 140 07/06/2018 1340   NA 144 03/03/2017 1253   K 4.3 07/06/2018 1340   K 3.6 03/03/2017 1253   CL 107  07/06/2018 1340   CO2 23 07/06/2018 1340   CO2 23 03/03/2017 1253   GLUCOSE 111 (H) 07/06/2018 1340   GLUCOSE 122 03/03/2017 1253   BUN 20 07/06/2018 1340  BUN 13.4 03/03/2017 1253   CREATININE 0.96 07/06/2018 1340   CREATININE 0.9 03/03/2017 1253   CALCIUM 9.3 07/06/2018 1340   CALCIUM 9.3 03/03/2017 1253   PROT 8.0 07/06/2018 1340   PROT 7.5 03/03/2017 1253   ALBUMIN 4.3 07/06/2018 1340   ALBUMIN 4.3 03/03/2017 1253   AST 29 07/06/2018 1340   AST 15 03/03/2017 1253   ALT 28 07/06/2018 1340   ALT 17 03/03/2017 1253   ALKPHOS 98 07/06/2018 1340   ALKPHOS 66 03/03/2017 1253   BILITOT 0.5 07/06/2018 1340   BILITOT 0.87 03/03/2017 1253   GFRNONAA >60 07/06/2018 1340   GFRAA >60 07/06/2018 1340       Component Value Date/Time   WBC 10.1 07/06/2018 1340   WBC 9.9 05/11/2017 1335   RBC 4.06 (L) 07/06/2018 1340   HGB 13.3 07/06/2018 1340   HGB 12.4 (L) 03/03/2017 1253   HCT 38.4 (L) 07/06/2018 1340   HCT 36.2 (L) 03/03/2017 1253   PLT 334 07/06/2018 1340   PLT 294 03/03/2017 1253   MCV 94.6 07/06/2018 1340   MCV 96.3 03/03/2017 1253   MCH 32.8 07/06/2018 1340   MCHC 34.6 07/06/2018 1340   RDW 12.4 07/06/2018 1340   RDW 13.2 03/03/2017 1253   LYMPHSABS 1.3 07/06/2018 1340   LYMPHSABS 1.2 03/03/2017 1253   MONOABS 0.6 07/06/2018 1340   MONOABS 0.5 03/03/2017 1253   EOSABS 0.0 07/06/2018 1340   EOSABS 0.0 03/03/2017 1253   BASOSABS 0.0 07/06/2018 1340   BASOSABS 0.1 03/03/2017 1253   -------------------------------  Imaging from last 24 hours (if applicable):  Radiology interpretation: No results found.      This case was discussed Dr. Alen Blew. He expressed agreement with my management of this patient.

## 2018-12-21 NOTE — Progress Notes (Signed)
These preliminary result these preliminary results were noted.  Awaiting final report.

## 2018-12-21 NOTE — Progress Notes (Signed)
Pt presents for assessment of L hip/buttock abscess that was last treated in June with oral abx and ointment abx.  Pt states "it's still tender and still draining thin green fluid".  Denies recent injury, fever/chills, SOB/CP, rash, N/V/D, or any bleeding, bruising, edema, or redness at site.

## 2018-12-22 NOTE — Progress Notes (Signed)
These preliminary result these preliminary results were noted.  Awaiting final report.

## 2018-12-24 LAB — AEROBIC CULTURE W GRAM STAIN (SUPERFICIAL SPECIMEN): Gram Stain: NONE SEEN

## 2018-12-24 LAB — AEROBIC CULTURE? (SUPERFICIAL SPECIMEN): Culture: NORMAL

## 2018-12-26 NOTE — Progress Notes (Signed)
These preliminary result these preliminary results were noted.  Awaiting final report.

## 2018-12-28 MED FILL — predniSONE 5 MG TABS: 5 | 30 days supply | Qty: 30 | Fill #1

## 2019-01-24 ENCOUNTER — Other Ambulatory Visit: Payer: Self-pay | Admitting: Oncology

## 2019-01-24 DIAGNOSIS — C61 Malignant neoplasm of prostate: Secondary | ICD-10-CM

## 2019-03-24 MED FILL — predniSONE 5 MG TABS: 5 | 30 days supply | Qty: 30 | Fill #1

## 2019-03-31 ENCOUNTER — Other Ambulatory Visit: Payer: Self-pay | Admitting: Oncology

## 2019-03-31 DIAGNOSIS — C61 Malignant neoplasm of prostate: Secondary | ICD-10-CM

## 2019-03-31 MED ORDER — ZYTIGA 250 MG PO TABS: 1000.0000 mg | ORAL_TABLET | Freq: Every day | ORAL | 1 refills | Status: DC

## 2019-04-03 ENCOUNTER — Telehealth: Payer: Self-pay | Admitting: Oncology

## 2019-04-03 NOTE — Telephone Encounter (Signed)
Scheduled appt per 1/29 sch message - mailed letter with appt date and time

## 2019-06-01 ENCOUNTER — Inpatient Hospital Stay: Payer: 59 | Attending: Oncology | Admitting: Oncology

## 2019-06-01 ENCOUNTER — Inpatient Hospital Stay: Payer: 59

## 2019-08-04 ENCOUNTER — Other Ambulatory Visit: Payer: Self-pay | Admitting: Oncology

## 2019-08-04 ENCOUNTER — Other Ambulatory Visit: Payer: Self-pay

## 2019-08-04 DIAGNOSIS — C61 Malignant neoplasm of prostate: Secondary | ICD-10-CM

## 2019-08-04 MED ORDER — MEGESTROL ACETATE 20 MG PO TABS: 20.0000 mg | ORAL_TABLET | Freq: Two times a day (BID) | ORAL | 3 refills | Status: DC

## 2019-08-04 MED ORDER — PREDNISONE 5 MG PO TABS: 5.0000 mg | ORAL_TABLET | Freq: Every day | ORAL | 3 refills | Status: DC

## 2019-08-04 MED FILL — predniSONE 5 MG TABS: 5 | 30 days supply | Qty: 30 | Fill #0

## 2019-10-26 ENCOUNTER — Telehealth: Payer: Self-pay | Admitting: Oncology

## 2019-10-26 NOTE — Telephone Encounter (Signed)
Called pt per 8/25 sch msg - unable to reach pt . Left message for patient to call back to reschedule appt.

## 2019-11-21 ENCOUNTER — Telehealth: Payer: Self-pay

## 2019-11-21 NOTE — Telephone Encounter (Signed)
Called patient per [place] to set up delivery for medication. Patient states he has two bottles already and does not need a delivery. Patient states he has told [facility] that he does not need a refill at this time.

## 2020-01-01 ENCOUNTER — Telehealth: Payer: Self-pay | Admitting: Oncology

## 2020-01-01 NOTE — Telephone Encounter (Signed)
Called pt per 11/1 sch msg - no answer. Left message for pt to call back to reschedule.

## 2020-01-11 ENCOUNTER — Other Ambulatory Visit: Payer: Self-pay | Admitting: Oncology

## 2020-01-11 DIAGNOSIS — C61 Malignant neoplasm of prostate: Secondary | ICD-10-CM

## 2020-01-12 ENCOUNTER — Inpatient Hospital Stay: Payer: 59 | Attending: Oncology

## 2020-01-12 ENCOUNTER — Other Ambulatory Visit: Payer: Self-pay

## 2020-01-12 ENCOUNTER — Inpatient Hospital Stay (HOSPITAL_BASED_OUTPATIENT_CLINIC_OR_DEPARTMENT_OTHER): Payer: 59 | Admitting: Oncology

## 2020-01-12 VITALS — BP 143/112 | HR 110 | Temp 97.0°F | Resp 18 | Ht 66.0 in | Wt 202.7 lb

## 2020-01-12 DIAGNOSIS — Z79899 Other long term (current) drug therapy: Secondary | ICD-10-CM | POA: Diagnosis not present

## 2020-01-12 DIAGNOSIS — E876 Hypokalemia: Secondary | ICD-10-CM | POA: Insufficient documentation

## 2020-01-12 DIAGNOSIS — I10 Essential (primary) hypertension: Secondary | ICD-10-CM | POA: Insufficient documentation

## 2020-01-12 DIAGNOSIS — C61 Malignant neoplasm of prostate: Secondary | ICD-10-CM

## 2020-01-12 DIAGNOSIS — E291 Testicular hypofunction: Secondary | ICD-10-CM | POA: Insufficient documentation

## 2020-01-12 LAB — CMP (CANCER CENTER ONLY)
ALT: 16 U/L (ref 0–44)
AST: 17 U/L (ref 15–41)
Albumin: 4.2 g/dL (ref 3.5–5.0)
Alkaline Phosphatase: 68 U/L (ref 38–126)
Anion gap: 10 (ref 5–15)
BUN: 14 mg/dL (ref 6–20)
CO2: 21 mmol/L — ABNORMAL LOW (ref 22–32)
Calcium: 9.1 mg/dL (ref 8.9–10.3)
Chloride: 111 mmol/L (ref 98–111)
Creatinine: 0.93 mg/dL (ref 0.61–1.24)
GFR, Estimated: >60 mL/min (ref >60)
Glucose, Bld: 121 mg/dL — ABNORMAL HIGH (ref 70–99)
Potassium: 3.1 mmol/L — ABNORMAL LOW (ref 3.5–5.1)
Sodium: 142 mmol/L (ref 135–145)
Total Bilirubin: 0.7 mg/dL (ref 0.3–1.2)
Total Protein: 7.5 g/dL (ref 6.5–8.1)

## 2020-01-12 LAB — CBC WITH DIFFERENTIAL (CANCER CENTER ONLY)
Abs Immature Granulocytes: 0.03 10*3/uL (ref 0.00–0.07)
Basophils Absolute: 0 10*3/uL (ref 0.0–0.1)
Basophils Relative: 0 %
Eosinophils Absolute: 0 10*3/uL (ref 0.0–0.5)
Eosinophils Relative: 0 %
HCT: 34.9 % — ABNORMAL LOW (ref 39.0–52.0)
Hemoglobin: 12.1 g/dL — ABNORMAL LOW (ref 13.0–17.0)
Immature Granulocytes: 0 %
Lymphocytes Relative: 17 %
Lymphs Abs: 2 10*3/uL (ref 0.7–4.0)
MCH: 33.8 pg (ref 26.0–34.0)
MCHC: 34.7 g/dL (ref 30.0–36.0)
MCV: 97.5 fL (ref 80.0–100.0)
Monocytes Absolute: 0.8 10*3/uL (ref 0.1–1.0)
Monocytes Relative: 6 %
Neutro Abs: 9 10*3/uL — ABNORMAL HIGH (ref 1.7–7.7)
Neutrophils Relative %: 77 %
Platelet Count: 294 10*3/uL (ref 150–400)
RBC: 3.58 MIL/uL — ABNORMAL LOW (ref 4.22–5.81)
RDW: 12.6 % (ref 11.5–15.5)
WBC Count: 11.9 10*3/uL — ABNORMAL HIGH (ref 4.0–10.5)
nRBC: 0 % (ref 0.0–0.2)

## 2020-01-12 NOTE — Progress Notes (Signed)
Hematology and Oncology Follow Up Visit  Charles [name] [id number] [birth year] 59 y.o. 01/12/2020 2:55 PM Patient, No Pcp PerNo ref. provider found   Principle Diagnosis: 59 year old with advanced prostate cancer diagnosed in June 2018.  He presented with castration-sensitive disease, PSA of 698 and a pelvic mass.   Prior Therapy: He is S/P left transgluteal pelvic mass biopsy on 08/28/2016.   Current therapy:  Lupron every 3 months given at [facility] urology.  He has not been receiving it regularly.  Zytiga 1000 mg daily with prednisone at 5 mg daily started in September 2018.  Interim History: Charles [name] presents today for a follow-up evaluation.  Since the last visit, he has failed to follow-up regularly and has not been receiving androgen deprivation therapy as scheduled.  He continues to take Zytiga per his report which she has tolerated very well.  He does report some hot flashes and erectile dysfunction for which he uses Cialis.  His performance status quality of life remained excellent.    Medications: I have reviewed the patient's current medications.  Current Outpatient Medications  Medication Sig Dispense Refill  . megestrol (MEGACE) 20 MG tablet Take 1 tablet (20 mg total) by mouth 2 (two) times daily. 90 tablet 3  . predniSONE (DELTASONE) 5 MG tablet Take 1 tablet (5 mg total) by mouth daily with breakfast. 30 tablet 3  . tadalafil (CIALIS) 20 MG tablet Take 20 mg by mouth as needed.    Marland Kitchen ZYTIGA 250 MG tablet Take 4 tablets (1,000 mg total) by mouth daily. Take on an empty stomach 1 hour before or 2 hours after a meal 120 tablet 1   No current facility-administered medications for this visit.     Allergies: No Known Allergies  on.  . Physical Exam: Blood pressure (!) 143/112, pulse (!) 110, temperature (!) 97 F (36.1 C), temperature source Tympanic, resp. rate 18, height 5\' 6"  (1.676 m), weight 202 lb 11.2 oz (91.9 kg), SpO2 99 %.   ECOG:  0    General appearance: Alert, awake without any distress. Head: Atraumatic without abnormalities Oropharynx: Without any thrush or ulcers. Eyes: No scleral icterus. Lymph nodes: No lymphadenopathy noted in the cervical, supraclavicular, or axillary nodes Heart:regular rate and rhythm, without any murmurs or gallops.   Lung: Clear to auscultation without any rhonchi, wheezes or dullness to percussion. Abdomin: Soft, nontender without any shifting dullness or ascites. Musculoskeletal: No clubbing or cyanosis. Neurological: No motor or sensory deficits. Skin: No rashes or lesions.     Lab Results: Lab Results  Component Value Date   WBC 11.9 (H) 01/12/2020   HGB 12.1 (L) 01/12/2020   HCT 34.9 (L) 01/12/2020   MCV 97.5 01/12/2020   PLT 294 01/12/2020     Chemistry      Component Value Date/Time   NA 140 07/06/2018 1340   NA 144 03/03/2017 1253   K 4.3 07/06/2018 1340   K 3.6 03/03/2017 1253   CL 107 07/06/2018 1340   CO2 23 07/06/2018 1340   CO2 23 03/03/2017 1253   BUN 20 07/06/2018 1340   BUN 13.4 03/03/2017 1253   CREATININE 0.96 07/06/2018 1340   CREATININE 0.9 03/03/2017 1253      Component Value Date/Time   CALCIUM 9.3 07/06/2018 1340   CALCIUM 9.3 03/03/2017 1253   ALKPHOS 98 07/06/2018 1340   ALKPHOS 66 03/03/2017 1253   AST 29 07/06/2018 1340   AST 15 03/03/2017 1253   ALT 28 07/06/2018 1340   ALT  17 03/03/2017 1253   BILITOT 0.5 07/06/2018 1340   BILITOT 0.87 03/03/2017 1253     Results for GRAINGER, MCCARLEY (MRN [id number]) as of 01/12/2020 14:42  Ref. Range 03/03/2017 12:53 05/11/2017 13:34  Prostate Specific Ag, Serum Latest Ref Range: 0.0 - 4.0 ng/mL 2.4 0.1     Impression and Plan:  59 year old with the following issues:  1.  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    His last PSA obtained on January 01, 2020 was 0.27 obtained at [facility] urology.  He has been on Zytiga although has not been on  androgen deprivation therapy.  The natural course of this disease was reviewed and treatment options were discussed.  He does have a slow rise in his PSA since 2019 although it is unclear if he developing castration-resistant disease given the fact that he has not received androgen deprivation for the last year and a half.  For the time being, I recommended continuing Zytiga and switch to alternative therapy if his PSA continues to rise.   2. Androgen depravation: Risks and benefits of restarting Eligard were reviewed today.  Problems such as hot flashes, weight gain and sexual dysfunction were reiterated today in detail.  He is agreeable to receive it at this time.  3.  Hypokalemia: His potassium will be updated today and replaced as needed.  4.  Liver function surveillance: Liver function test will be repeated today.  5.  Hypertension: No issues reported between visits although his blood pressure is elevated today.  6. Goals of care: Therapy remains palliative although aggressive measures are warranted given his excellent performance status.  7.  Follow-up: He will return in the near future for Eligard and in 4 months for repeat evaluation.  30  minutes were dedicated to this visit. The time was spent on reviewing laboratory data, discussing treatment options, discussing differential diagnosis and answering questions regarding future plan.     Zola Button, MD 11/12/20212:55 PM

## 2020-01-13 LAB — PROSTATE-SPECIFIC AG, SERUM (LABCORP): Prostate Specific Ag, Serum: 0.3 ng/mL (ref 0.0–4.0)

## 2020-01-23 ENCOUNTER — Inpatient Hospital Stay: Payer: 59

## 2020-02-08 DIAGNOSIS — N200 Calculus of kidney: Secondary | ICD-10-CM | POA: Insufficient documentation

## 2020-02-08 DIAGNOSIS — N189 Chronic kidney disease, unspecified: Secondary | ICD-10-CM | POA: Insufficient documentation

## 2020-02-08 DIAGNOSIS — I129 Hypertensive chronic kidney disease with stage 1 through stage 4 chronic kidney disease, or unspecified chronic kidney disease: Secondary | ICD-10-CM | POA: Diagnosis not present

## 2020-02-08 DIAGNOSIS — R1032 Left lower quadrant pain: Secondary | ICD-10-CM | POA: Diagnosis present

## 2020-02-08 DIAGNOSIS — Z8546 Personal history of malignant neoplasm of prostate: Secondary | ICD-10-CM | POA: Diagnosis not present

## 2020-02-09 ENCOUNTER — Encounter (HOSPITAL_COMMUNITY): Payer: Self-pay | Admitting: Emergency Medicine

## 2020-02-09 ENCOUNTER — Emergency Department (HOSPITAL_COMMUNITY)
Admission: EM | Admit: 2020-02-09 | Discharge: 2020-02-09 | Disposition: A | Discharge status: Home / Self Care | Payer: 59 | Attending: Emergency Medicine | Admitting: Emergency Medicine

## 2020-02-09 ENCOUNTER — Other Ambulatory Visit: Payer: Self-pay

## 2020-02-09 ENCOUNTER — Emergency Department (HOSPITAL_COMMUNITY): Payer: 59

## 2020-02-09 DIAGNOSIS — N200 Calculus of kidney: Secondary | ICD-10-CM

## 2020-02-09 LAB — COMPREHENSIVE METABOLIC PANEL
ALT: 20 U/L (ref 0–44)
AST: 23 U/L (ref 15–41)
Albumin: 3.9 g/dL (ref 3.5–5.0)
Alkaline Phosphatase: 63 U/L (ref 38–126)
Anion gap: 13 (ref 5–15)
BUN: 10 mg/dL (ref 6–20)
CO2: 20 mmol/L — ABNORMAL LOW (ref 22–32)
Calcium: 8.8 mg/dL — ABNORMAL LOW (ref 8.9–10.3)
Chloride: 109 mmol/L (ref 98–111)
Creatinine, Ser: 1.14 mg/dL (ref 0.61–1.24)
GFR, Estimated: >60 mL/min (ref >60)
Glucose, Bld: 141 mg/dL — ABNORMAL HIGH (ref 70–99)
Potassium: 3.1 mmol/L — ABNORMAL LOW (ref 3.5–5.1)
Sodium: 142 mmol/L (ref 135–145)
Total Bilirubin: 0.5 mg/dL (ref 0.3–1.2)
Total Protein: 6.8 g/dL (ref 6.5–8.1)

## 2020-02-09 LAB — URINALYSIS, ROUTINE W REFLEX MICROSCOPIC
Bacteria, UA: NONE SEEN
Bilirubin Urine: NEGATIVE
Glucose, UA: NEGATIVE mg/dL
Ketones, ur: NEGATIVE mg/dL
Leukocytes,Ua: NEGATIVE
Nitrite: NEGATIVE
Protein, ur: NEGATIVE mg/dL
RBC / HPF: >50 RBC/hpf — ABNORMAL HIGH (ref 0–5)
Specific Gravity, Urine: 1.01 (ref 1.005–1.030)
pH: 5 (ref 5.0–8.0)

## 2020-02-09 LAB — CBC
HCT: 33.8 % — ABNORMAL LOW (ref 39.0–52.0)
Hemoglobin: 12 g/dL — ABNORMAL LOW (ref 13.0–17.0)
MCH: 34.9 pg — ABNORMAL HIGH (ref 26.0–34.0)
MCHC: 35.5 g/dL (ref 30.0–36.0)
MCV: 98.3 fL (ref 80.0–100.0)
Platelets: 313 K/uL (ref 150–400)
RBC: 3.44 MIL/uL — ABNORMAL LOW (ref 4.22–5.81)
RDW: 12.7 % (ref 11.5–15.5)
WBC: 9.2 K/uL (ref 4.0–10.5)
nRBC: 0 % (ref 0.0–0.2)

## 2020-02-09 LAB — LIPASE, BLOOD: Lipase: 24 U/L (ref 11–51)

## 2020-02-09 MED ORDER — KETOROLAC TROMETHAMINE 30 MG/ML IJ SOLN
15.0000 mg | Freq: Once | INTRAMUSCULAR | Status: AC
  Administered 2020-02-09: 15 mg via INTRAVENOUS
  Filled 2020-02-09: qty 1

## 2020-02-09 MED ORDER — OXYCODONE-ACETAMINOPHEN 5-325 MG PO TABS: 2.0000 | ORAL_TABLET | Freq: Three times a day (TID) | ORAL | 0 refills | Status: DC | PRN

## 2020-02-09 MED ORDER — SODIUM CHLORIDE 0.9 % IV BOLUS
1000.0000 mL | Freq: Once | INTRAVENOUS | Status: AC
  Administered 2020-02-09: 1000 mL via INTRAVENOUS

## 2020-02-09 MED ORDER — OXYCODONE-ACETAMINOPHEN 5-325 MG PO TABS
1.0000 | ORAL_TABLET | Freq: Once | ORAL | Status: AC
  Administered 2020-02-09: 1 via ORAL
  Filled 2020-02-09: qty 1

## 2020-02-09 MED ORDER — FENTANYL CITRATE (PF) 100 MCG/2ML IJ SOLN
50.0000 ug | Freq: Once | INTRAMUSCULAR | Status: AC
  Administered 2020-02-09: 50 ug via INTRAVENOUS
  Filled 2020-02-09: qty 2

## 2020-02-09 MED ORDER — ONDANSETRON 4 MG PO TBDP: 4.0000 mg | ORAL_TABLET | Freq: Three times a day (TID) | ORAL | 0 refills | Status: DC | PRN

## 2020-02-09 MED ORDER — TAMSULOSIN HCL 0.4 MG PO CAPS: 0.4000 mg | ORAL_CAPSULE | Freq: Every day | ORAL | 0 refills | Status: DC

## 2020-02-09 MED ORDER — IBUPROFEN 400 MG PO TABS: 400.0000 mg | ORAL_TABLET | Freq: Three times a day (TID) | ORAL | 0 refills | Status: AC

## 2020-02-09 NOTE — ED Notes (Signed)
Patient transported to CT 

## 2020-02-09 NOTE — ED Triage Notes (Signed)
Patient reports LLQ abdominal pain and left lower back pain this evening with emesis and diarrhea , no fever or chills .

## 2020-02-09 NOTE — ED Provider Notes (Signed)
[facility] EMERGENCY DEPARTMENT Provider Note   CSN: [id number] Arrival date & time: 02/08/20  2345     History Chief Complaint  Patient presents with  . Abdominal Pain  . Back Pain    Charles [name] is a 59 y.o. male.  HPI Patient presents with acute onset left flank pain. Onset was 10:30 PM, yesterday, about 10 hours ago.  Patient was in his usual state of health prior to the onset. This does include ongoing oral chemotherapy for prostate cancer.  However, the patient denies other ongoing medical issues, states that he is otherwise healthy. Onset was severe, sharp, with focal left flank pain.  There is associated nausea, vomiting. Since that time pain is improved somewhat with oral narcotics, but the patient has persistent nausea. No fever, no other abdominal pain.  No dysuria, though there is gross hematuria.     Past Medical History:  Diagnosis Date  . Blurred vision   . Chronic kidney disease    renal cyst  . Diverticulosis   . Hemorrhoids   . Hypertension   . Prostate cancer ([place])   . Rectal bleeding     Patient Active Problem List   Diagnosis Date Noted  . Malignant neoplasm of prostate ([place]) 09/17/2016    Past Surgical History:  Procedure Laterality Date  . CIRCUMCISION    . needle core biopsy         Family History  Problem Relation Age of Onset  . Colon cancer Neg Hx   . Esophageal cancer Neg Hx   . Rectal cancer Neg Hx   . Stomach cancer Neg Hx   . Cancer Neg Hx     Social History   Tobacco Use  . Smoking status: Never Smoker  . Smokeless tobacco: Never Used  Vaping Use  . Vaping Use: Never used  Substance Use Topics  . Alcohol use: Yes    Alcohol/week: 2.0 standard drinks    Types: 2 Cans of beer per week    Comment: daily use /2 24ouce cans  . Drug use: No    Home Medications Prior to Admission medications   Medication Sig Start Date End Date Taking? Authorizing Provider  megestrol (MEGACE) 20 MG  tablet Take 1 tablet (20 mg total) by mouth 2 (two) times daily. 08/04/19   Wyatt Portela, MD  predniSONE (DELTASONE) 5 MG tablet Take 1 tablet (5 mg total) by mouth daily with breakfast. 08/04/19   Wyatt Portela, MD  tadalafil (CIALIS) 20 MG tablet Take 20 mg by mouth as needed. 08/04/18   [provider]  ZYTIGA 250 MG tablet Take 4 tablets (1,000 mg total) by mouth daily. Take on an empty stomach 1 hour before or 2 hours after a meal 03/31/19   Wyatt Portela, MD    Allergies    Patient has no known allergies.  Review of Systems   Review of Systems  Constitutional:       Per HPI, otherwise negative  HENT:       Per HPI, otherwise negative  Respiratory:       Per HPI, otherwise negative  Cardiovascular:       Per HPI, otherwise negative  Gastrointestinal: Positive for abdominal pain, nausea and vomiting.  Endocrine:       Negative aside from HPI  Genitourinary:       Neg aside from HPI   Musculoskeletal:       Per HPI, otherwise negative  Skin: Negative.  Allergic/Immunologic: Positive for immunocompromised state.  Neurological: Negative for syncope.    Physical Exam Updated Vital Signs BP (!) 151/73   Pulse (!) 59   Temp 97.8 F (36.6 C)   Resp 20   Ht 5\' 6"  (1.676 m)   Wt 102 kg   SpO2 100%   BMI 36.29 kg/m   Physical Exam Vitals and nursing note reviewed.  Constitutional:      General: He is not in acute distress.    Appearance: He is well-developed.  HENT:     Head: Normocephalic and atraumatic.  Eyes:     Extraocular Movements: EOM normal.     Conjunctiva/sclera: Conjunctivae normal.  Cardiovascular:     Rate and Rhythm: Normal rate and regular rhythm.  Pulmonary:     Effort: Pulmonary effort is normal. No respiratory distress.     Breath sounds: No stridor.  Abdominal:     General: There is no distension.     Tenderness: There is abdominal tenderness in the left upper quadrant. There is left CVA tenderness.  Musculoskeletal:         General: No edema.  Skin:    General: Skin is warm and dry.  Neurological:     Mental Status: He is alert and oriented to person, place, and time.  Psychiatric:        Mood and Affect: Mood and affect normal.     ED Results / Procedures / Treatments   Labs (all labs ordered are listed, but only abnormal results are displayed) Labs Reviewed  COMPREHENSIVE METABOLIC PANEL - Abnormal; Notable for the following components:      Result Value   Potassium 3.1 (*)    CO2 20 (*)    Glucose, Bld 141 (*)    Calcium 8.8 (*)    All other components within normal limits  CBC - Abnormal; Notable for the following components:   RBC 3.44 (*)    Hemoglobin 12.0 (*)    HCT 33.8 (*)    MCH 34.9 (*)    All other components within normal limits  URINALYSIS, ROUTINE W REFLEX MICROSCOPIC - Abnormal; Notable for the following components:   Color, Urine STRAW (*)    APPearance HAZY (*)    Hgb urine dipstick LARGE (*)    RBC / HPF >50 (*)    All other components within normal limits  LIPASE, BLOOD    EKG None  Radiology CT Renal Stone Study  Result Date: 02/09/2020 CLINICAL DATA:  Left-sided flank pain with hematuria. History of prostate carcinoma. EXAM: CT ABDOMEN AND PELVIS WITHOUT CONTRAST TECHNIQUE: Multidetector CT imaging of the abdomen and pelvis was performed following the standard protocol without IV contrast. COMPARISON:  07/31/2016 FINDINGS: Lower chest: Choose 1 Hepatobiliary: Liver normal in size and attenuation. Low-density lesion, segment 2, consistent with a cyst, stable, 1.4 cm. Tiny low-density lesion at the dome of segment 7 also likely a cyst. No other liver masses or lesions. Normal gallbladder. No bile duct dilation. Pancreas: Unremarkable. No pancreatic ductal dilatation or surrounding inflammatory changes. Spleen: Normal in size without focal abnormality. Adrenals/Urinary Tract: No adrenal masses. 5 mm stone in the proximal left ureter causes mild left hydronephrosis and left  perinephric stranding. Small nonobstructing stones in the lower pole the right kidney. No other intrarenal stones. No right hydronephrosis. Bilateral low-attenuation renal masses consistent with cysts, stable from the prior exam. Bladder is unremarkable. Stomach/Bowel: Normal stomach and small bowel. Scattered left colon diverticula. No diverticulitis or other: Inflammatory process.  Normal appendix visualized. Vascular/Lymphatic: Minimal aortic atherosclerotic calcifications. No enlarged lymph nodes. Reproductive: Unremarkable. Other: No abdominal wall hernia or abnormality. No abdominopelvic ascites. Musculoskeletal: No fracture or acute finding.  No bone lesion. IMPRESSION: 1. 5 mm stone in the proximal left ureter causes mild left hydronephrosis and left perinephric stranding. 2. No other acute abnormality within the abdomen or pelvis. 3. Small, lower pole, right intrarenal nonobstructing stones. Electronically Signed   By: Lajean Manes M.D.   On: 02/09/2020 11:35    Procedures Procedures (including critical care time)  Medications Ordered in ED Medications  oxyCODONE-acetaminophen (PERCOCET/ROXICET) 5-325 MG per tablet 1 tablet (has no administration in time range)  oxyCODONE-acetaminophen (PERCOCET/ROXICET) 5-325 MG per tablet 1 tablet (1 tablet Oral Given 02/09/20 0041)  sodium chloride 0.9 % bolus 1,000 mL (0 mLs Intravenous Stopped 02/09/20 1042)  fentaNYL (SUBLIMAZE) injection 50 mcg (50 mcg Intravenous Given 02/09/20 0903)  ketorolac (TORADOL) 30 MG/ML injection 15 mg (15 mg Intravenous Given 02/09/20 0240)    ED Course  I have reviewed the triage vital signs and the nursing notes.  Pertinent labs & imaging results that were available during my care of the patient were reviewed by me and considered in my medical decision making (see chart for details).   Review after initial evaluation with appropriate oncology notes as below: Principle Diagnosis: 59 year old with advanced prostate  cancer diagnosed in June 2018.  He presented with castration-sensitive disease, PSA of 698 and a pelvic mass.     Prior Therapy: He is S/P left transgluteal pelvic mass biopsy on 08/28/2016.    Current therapy:   Lupron every 3 months given at [facility]rology.  He has not been receiving it regularly.   Zytiga 1000 mg daily with prednisone at 5 mg daily started in September 2018.   Interim History: Mr. Huish presents today for a follow-up evaluation.  Since the last visit, he has failed to follow-up regularly and has not been receiving androgen deprivation therapy as scheduled.  He continues to take Zytiga per his report which she has tolerated very well.  He does report some hot flashes and erectile dysfunction for which he uses Cialis.  His performance status quality of life remained excellent.  MDM Rules/Calculators/A&P                          12:22 PM Patient wake alert, feels better following the fentanyl, Toradol, fluids. We lengthy conversation about today's findings and I reviewed his CT results myself. Evidence for 5 mm stone, mild left hydronephrosis, no complete obstruction, no concurrent infection. With his improvement here, patient will follow up with his urologist after initiation of ongoing appropriate therapy as an outpatient.  Final Clinical Impression(s) / ED Diagnoses Final diagnoses:  Nephrolithiasis   MDM Number of Diagnoses or Management Options Nephrolithiasis: new, needed workup   Amount and/or Complexity of Data Reviewed Clinical lab tests: reviewed Tests in the radiology section of CPT: reviewed Tests in the medicine section of CPT: reviewed Decide to obtain previous medical records or to obtain history from someone other than the patient: yes Review and summarize past medical records: yes Independent visualization of images, tracings, or specimens: yes  Risk of Complications, Morbidity, and/or Mortality Presenting problems: high Diagnostic  procedures: high Management options: high  Critical Care Total time providing critical care: < 30 minutes  Patient Progress Patient progress: stable  Rx / DC Orders ED Discharge Orders  Ordered    oxyCODONE-acetaminophen (PERCOCET/ROXICET) 5-325 MG tablet  Every 8 hours PRN        02/09/20 1229    ibuprofen (ADVIL) 400 MG tablet  3 times daily        02/09/20 1229    tamsulosin (FLOMAX) 0.4 MG CAPS capsule  Daily        02/09/20 1229    ondansetron (ZOFRAN ODT) 4 MG disintegrating tablet  Every 8 hours PRN        02/09/20 1229           Carmin Muskrat, MD 02/09/20 1229

## 2020-02-09 NOTE — Discharge Instructions (Signed)
As discussed, you have been diagnosed with kidney stones.  It is important that you monitor your condition carefully, take all medication as prescribed and stay well-hydrated. Please schedule follow-up with your urologist.  Return here for concerning changes in your condition.

## 2020-03-12 ENCOUNTER — Other Ambulatory Visit: Payer: 59

## 2020-03-12 DIAGNOSIS — Z20822 Contact with and (suspected) exposure to covid-19: Secondary | ICD-10-CM

## 2020-03-14 LAB — NOVEL CORONAVIRUS, NAA: SARS-CoV-2, NAA: DETECTED — AB

## 2020-03-14 LAB — SARS-COV-2, NAA 2 DAY TAT

## 2020-03-15 ENCOUNTER — Encounter: Payer: Self-pay | Admitting: Physician Assistant

## 2020-03-15 ENCOUNTER — Telehealth: Payer: Self-pay | Admitting: Physician Assistant

## 2020-03-15 DIAGNOSIS — C61 Malignant neoplasm of prostate: Secondary | ICD-10-CM | POA: Insufficient documentation

## 2020-03-15 DIAGNOSIS — N189 Chronic kidney disease, unspecified: Secondary | ICD-10-CM | POA: Insufficient documentation

## 2020-03-15 DIAGNOSIS — I10 Essential (primary) hypertension: Secondary | ICD-10-CM | POA: Insufficient documentation

## 2020-03-15 NOTE — Telephone Encounter (Signed)
Called to discuss with Zella Richer Noa about Covid symptoms and the use of sotrovimab, remdisivir or oral therapies for those with mild to moderate Covid symptoms and at a high risk of hospitalization.     Pt does not qualify as pt's symptoms first presented > 10 days prior to timing of infusion. Symptoms tier reviewed as well as criteria for ending isolation. Preventative practices reviewed. Patient verbalized understanding.  Patient Active Problem List   Diagnosis Date Noted  . Prostate cancer ([place])   . Morbid obesity ([place])   . Hypertension   . Chronic kidney disease   . Malignant neoplasm of prostate ([place]) 09/17/2016    Angelena Form PA-C

## 2020-06-04 ENCOUNTER — Inpatient Hospital Stay: Payer: 59

## 2020-06-04 ENCOUNTER — Telehealth: Payer: Self-pay | Admitting: *Deleted

## 2020-06-04 ENCOUNTER — Inpatient Hospital Stay: Payer: 59 | Attending: Oncology | Admitting: Oncology

## 2020-06-04 NOTE — Telephone Encounter (Signed)
Phone call to patient, no answer, left voicemail about missed appointments today.  Patient instructed to call office to discuss rescheduling. [phone]

## 2020-06-05 ENCOUNTER — Telehealth: Payer: Self-pay | Admitting: Oncology

## 2020-06-05 NOTE — Telephone Encounter (Signed)
R/s appts per 4/5 sch msg. Pt aware.  

## 2020-06-21 ENCOUNTER — Other Ambulatory Visit: Payer: 59

## 2020-06-21 ENCOUNTER — Ambulatory Visit: Payer: 59 | Admitting: Oncology

## 2020-06-21 ENCOUNTER — Ambulatory Visit: Payer: 59

## 2020-07-17 ENCOUNTER — Telehealth: Payer: Self-pay | Admitting: *Deleted

## 2020-07-17 ENCOUNTER — Inpatient Hospital Stay: Payer: 59

## 2020-07-17 ENCOUNTER — Inpatient Hospital Stay: Payer: 59 | Admitting: Oncology

## 2020-07-17 ENCOUNTER — Telehealth: Payer: Self-pay | Admitting: Oncology

## 2020-07-17 NOTE — Telephone Encounter (Signed)
Patient returned office call, was unaware of appointments today, asking for them to be rescheduled.  Scheduling message sent.

## 2020-07-17 NOTE — Telephone Encounter (Signed)
PC to patient regarding missed appointments this a.m., no answer, left VM for patient to call office.  [phone]

## 2020-07-17 NOTE — Telephone Encounter (Signed)
R/s appts per 5/18 sch msg. Pt aware.  

## 2020-07-30 ENCOUNTER — Inpatient Hospital Stay: Payer: 59 | Admitting: Oncology

## 2020-07-30 ENCOUNTER — Inpatient Hospital Stay: Payer: 59

## 2020-07-30 ENCOUNTER — Telehealth: Payer: Self-pay | Admitting: *Deleted

## 2020-07-30 ENCOUNTER — Inpatient Hospital Stay: Payer: 59 | Attending: Oncology

## 2020-07-30 NOTE — Telephone Encounter (Signed)
PC to patient regarding today's missed appointments, no answer, left VM for patient to call office [phone].  Scheduling message sent to reschedule appointments.

## 2020-08-01 ENCOUNTER — Telehealth: Payer: Self-pay | Admitting: Oncology

## 2020-08-01 NOTE — Telephone Encounter (Signed)
R/s appts per 5/31 sch msg. Pt aware.  

## 2020-09-06 ENCOUNTER — Encounter: Payer: Self-pay | Admitting: Oncology

## 2020-09-10 ENCOUNTER — Ambulatory Visit: Payer: 59 | Admitting: Oncology

## 2020-09-13 ENCOUNTER — Inpatient Hospital Stay: Payer: 59

## 2020-09-13 ENCOUNTER — Inpatient Hospital Stay: Payer: 59 | Admitting: Oncology

## 2020-11-20 ENCOUNTER — Encounter (HOSPITAL_COMMUNITY): Payer: Self-pay

## 2020-11-20 ENCOUNTER — Other Ambulatory Visit: Payer: Self-pay

## 2020-11-20 ENCOUNTER — Emergency Department (HOSPITAL_COMMUNITY): Payer: Self-pay

## 2020-11-20 ENCOUNTER — Emergency Department (HOSPITAL_COMMUNITY)
Admission: EM | Admit: 2020-11-20 | Discharge: 2020-11-20 | Disposition: A | Discharge status: Home / Self Care | Payer: Self-pay | Attending: Emergency Medicine | Admitting: Emergency Medicine

## 2020-11-20 DIAGNOSIS — R059 Cough, unspecified: Secondary | ICD-10-CM | POA: Insufficient documentation

## 2020-11-20 DIAGNOSIS — Z8546 Personal history of malignant neoplasm of prostate: Secondary | ICD-10-CM | POA: Insufficient documentation

## 2020-11-20 DIAGNOSIS — N281 Cyst of kidney, acquired: Secondary | ICD-10-CM | POA: Insufficient documentation

## 2020-11-20 DIAGNOSIS — R112 Nausea with vomiting, unspecified: Secondary | ICD-10-CM | POA: Insufficient documentation

## 2020-11-20 DIAGNOSIS — Z20822 Contact with and (suspected) exposure to covid-19: Secondary | ICD-10-CM | POA: Insufficient documentation

## 2020-11-20 LAB — URINALYSIS, ROUTINE W REFLEX MICROSCOPIC
Bilirubin Urine: NEGATIVE
Glucose, UA: NEGATIVE mg/dL
Hgb urine dipstick: NEGATIVE
Ketones, ur: NEGATIVE mg/dL
Leukocytes,Ua: NEGATIVE
Nitrite: NEGATIVE
Protein, ur: NEGATIVE mg/dL
Specific Gravity, Urine: 1.03 (ref 1.005–1.030)
pH: 6 (ref 5.0–8.0)

## 2020-11-20 LAB — CBC
HCT: 36.4 % — ABNORMAL LOW (ref 39.0–52.0)
Hemoglobin: 12.5 g/dL — ABNORMAL LOW (ref 13.0–17.0)
MCH: 32.8 pg (ref 26.0–34.0)
MCHC: 34.3 g/dL (ref 30.0–36.0)
MCV: 95.5 fL (ref 80.0–100.0)
Platelets: 282 10*3/uL (ref 150–400)
RBC: 3.81 MIL/uL — ABNORMAL LOW (ref 4.22–5.81)
RDW: 14.4 % (ref 11.5–15.5)
WBC: 5 10*3/uL (ref 4.0–10.5)
nRBC: 0 % (ref 0.0–0.2)

## 2020-11-20 LAB — LIPASE, BLOOD: Lipase: 24 U/L (ref 11–51)

## 2020-11-20 LAB — COMPREHENSIVE METABOLIC PANEL
ALT: 23 U/L (ref 0–44)
AST: 30 U/L (ref 15–41)
Albumin: 4.2 g/dL (ref 3.5–5.0)
Alkaline Phosphatase: 85 U/L (ref 38–126)
Anion gap: 9 (ref 5–15)
BUN: 19 mg/dL (ref 6–20)
CO2: 25 mmol/L (ref 22–32)
Calcium: 9.1 mg/dL (ref 8.9–10.3)
Chloride: 108 mmol/L (ref 98–111)
Creatinine, Ser: 0.91 mg/dL (ref 0.61–1.24)
GFR, Estimated: >60 mL/min (ref >60)
Glucose, Bld: 96 mg/dL (ref 70–99)
Potassium: 4 mmol/L (ref 3.5–5.1)
Sodium: 142 mmol/L (ref 135–145)
Total Bilirubin: 0.8 mg/dL (ref 0.3–1.2)
Total Protein: 7.5 g/dL (ref 6.5–8.1)

## 2020-11-20 MED ORDER — ONDANSETRON 4 MG PO TBDP
4.0000 mg | ORAL_TABLET | Freq: Once | ORAL | Status: AC
  Administered 2020-11-20: 4 mg via ORAL
  Filled 2020-11-20: qty 1

## 2020-11-20 NOTE — ED Provider Notes (Signed)
[facility] DEPT Provider Note   CSN: [id number] Arrival date & time: 11/20/20  1003     History Chief Complaint  Patient presents with   Vomiting    Charles [name] is a 60 y.o. male presenting for evaluation of nausea, vomiting, left flank pain.  Patient states he has had a tightness and discomfort in his left flank for several days.  This feels reminiscent with when he had a kidney stone last year.  He states this morning he had nausea, threw up 3 times.  He still feeling nauseous.  He has not taken anything for his symptoms.  He denies fevers, chest pain, shortness of breath, abdominal pain, urinary symptoms, normal bowel movements.  He states he recently spent time with somebody who then tested positive for COVID.  He is requesting test.  Reports a mild cough.   He takes no medications daily.   HPI     Past Medical History:  Diagnosis Date   Blurred vision    Chronic kidney disease    renal cyst   Diverticulosis    Hemorrhoids    Hypertension    Morbid obesity ([place])    Prostate cancer ([place])    Rectal bleeding     Patient Active Problem List   Diagnosis Date Noted   Prostate cancer ([place])    Morbid obesity ([place])    Hypertension    Chronic kidney disease    Malignant neoplasm of prostate ([place]) 09/17/2016    Past Surgical History:  Procedure Laterality Date   CIRCUMCISION     needle core biopsy         Family History  Problem Relation Age of Onset   Colon cancer Neg Hx    Esophageal cancer Neg Hx    Rectal cancer Neg Hx    Stomach cancer Neg Hx    Cancer Neg Hx     Social History   Tobacco Use   Smoking status: Never   Smokeless tobacco: Never  Vaping Use   Vaping Use: Never used  Substance Use Topics   Alcohol use: Yes    Alcohol/week: 2.0 standard drinks    Types: 2 Cans of beer per week    Comment: daily use /2 24ouce cans   Drug use: No    Home Medications Prior to Admission medications   Medication  Sig Start Date End Date Taking? Authorizing Provider  megestrol (MEGACE) 20 MG tablet Take 1 tablet (20 mg total) by mouth 2 (two) times daily. 08/04/19   Wyatt Portela, MD  ondansetron (ZOFRAN ODT) 4 MG disintegrating tablet Take 1 tablet (4 mg total) by mouth every 8 (eight) hours as needed for nausea or vomiting. 02/09/20   Carmin Muskrat, MD  oxyCODONE-acetaminophen (PERCOCET/ROXICET) 5-325 MG tablet Take 2 tablets by mouth every 8 (eight) hours as needed for severe pain. 02/09/20   Carmin Muskrat, MD  predniSONE (DELTASONE) 5 MG tablet Take 1 tablet (5 mg total) by mouth daily with breakfast. 08/04/19   Wyatt Portela, MD  tadalafil (CIALIS) 20 MG tablet Take 20 mg by mouth as needed. 08/04/18   [provider]  tamsulosin (FLOMAX) 0.4 MG CAPS capsule Take 1 capsule (0.4 mg total) by mouth daily. 02/09/20   Carmin Muskrat, MD  ZYTIGA 250 MG tablet Take 4 tablets (1,000 mg total) by mouth daily. Take on an empty stomach 1 hour before or 2 hours after a meal 03/31/19   Wyatt Portela, MD  Allergies    Patient has no known allergies.  Review of Systems   Review of Systems  Gastrointestinal:  Positive for nausea and vomiting.  Genitourinary:  Positive for flank pain.  All other systems reviewed and are negative.  Physical Exam Updated Vital Signs BP (!) 153/97   Pulse 65   Temp 98.6 F (37 C) (Oral)   Resp 16   SpO2 100%   Physical Exam Vitals and nursing note reviewed.  Constitutional:      General: He is not in acute distress.    Appearance: Normal appearance.  HENT:     Head: Normocephalic and atraumatic.  Eyes:     Conjunctiva/sclera: Conjunctivae normal.     Pupils: Pupils are equal, round, and reactive to light.  Cardiovascular:     Rate and Rhythm: Normal rate and regular rhythm.     Pulses: Normal pulses.  Pulmonary:     Effort: Pulmonary effort is normal. No respiratory distress.     Breath sounds: Normal breath sounds. No wheezing.     Comments:  Speaking in full sentences.  Clear lung sounds in all fields. Abdominal:     General: There is no distension.     Palpations: Abdomen is soft. There is no mass.     Tenderness: There is no abdominal tenderness. There is no right CVA tenderness, guarding or rebound.     Comments: Mild discomfort with palpation of the left flank.  No tenderness palpation on the right side.  No abdominal tenderness.  Musculoskeletal:        General: Normal range of motion.     Cervical back: Normal range of motion and neck supple.  Skin:    General: Skin is warm and dry.     Capillary Refill: Capillary refill takes less than 2 seconds.  Neurological:     Mental Status: He is alert and oriented to person, place, and time.  Psychiatric:        Mood and Affect: Mood and affect normal.        Speech: Speech normal.        Behavior: Behavior normal.    ED Results / Procedures / Treatments   Labs (all labs ordered are listed, but only abnormal results are displayed) Labs Reviewed  CBC - Abnormal; Notable for the following components:      Result Value   RBC 3.81 (*)    Hemoglobin 12.5 (*)    HCT 36.4 (*)    All other components within normal limits  URINALYSIS, ROUTINE W REFLEX MICROSCOPIC - Abnormal; Notable for the following components:   Bacteria, UA RARE (*)    All other components within normal limits  SARS CORONAVIRUS 2 (TAT 6-24 HRS)  LIPASE, BLOOD  COMPREHENSIVE METABOLIC PANEL    EKG None  Radiology CT Renal Stone Study  Result Date: 11/20/2020 CLINICAL DATA:  Left flank pain. EXAM: CT ABDOMEN AND PELVIS WITHOUT CONTRAST TECHNIQUE: Multidetector CT imaging of the abdomen and pelvis was performed following the standard protocol without IV contrast. COMPARISON:  February 09, 2020 FINDINGS: Lower chest: No acute abnormality. Hepatobiliary: A 1.5 cm cyst is seen within the left lobe of the liver. No gallstones, gallbladder wall thickening, or biliary dilatation. Pancreas: Unremarkable. No  pancreatic ductal dilatation or surrounding inflammatory changes. Spleen: Normal in size without focal abnormality. Adrenals/Urinary Tract: Adrenal glands are unremarkable. Kidneys are normal in size, without obstructing renal calculi or hydronephrosis. A 3 mm nonobstructing renal stone is seen within the posterior aspect  of the mid to lower right kidney. Numerous bilateral renal cysts are seen. Mild, stable diffuse urinary bladder wall thickening is noted. Stomach/Bowel: Stomach is within normal limits. Appendix appears normal. No evidence of bowel wall thickening, distention, or inflammatory changes. Noninflamed diverticula are seen within the proximal sigmoid colon. Vascular/Lymphatic: No significant vascular findings are present. No enlarged abdominal or pelvic lymph nodes. Reproductive: Prostate is unremarkable. Other: No abdominal wall hernia or abnormality. No abdominopelvic ascites. Musculoskeletal: No acute or significant osseous findings. IMPRESSION: 1. 3 mm nonobstructing right renal calculus. 2. Numerous bilateral renal cysts. 3. Sigmoid diverticulosis. Electronically Signed   By: Virgina Norfolk M.D.   On: 11/20/2020 15:34    Procedures Procedures   Medications Ordered in ED Medications  ondansetron (ZOFRAN-ODT) disintegrating tablet 4 mg (4 mg Oral Given 11/20/20 1435)    ED Course  I have reviewed the triage vital signs and the nursing notes.  Pertinent labs & imaging results that were available during my care of the patient were reviewed by me and considered in my medical decision making (see chart for details).    MDM Rules/Calculators/A&P                           Patient presenting for evaluation of nausea, vomiting, flank pain.  On exam, patient appears nontoxic.  He does have some very mild discomfort with palpation of the left flank.  He is very well-appearing, does not appear in pain, low suspicion for kidney stone.  However considering his history, will obtain a scan.   Labs obtained from triage interpreted by me, overall reassuring.  Will obtain send out COVID test, tx symptomatically and reassess.   Ct negative for acute findings.  Shows multiple kidney cysts.  Shows stones in the kidneys, but nothing in the ureters.  Discussed findings with patient.  Discussed symptomatic management and follow-up with PCP regarding kidney cysts.  At this time, patient appears safe for discharge.  Return precautions given. Pt states he understands and agrees to plan.   Final Clinical Impression(s) / ED Diagnoses Final diagnoses:  Renal cyst  Nausea and vomiting, intractability of vomiting not specified, unspecified vomiting type    Rx / DC Orders ED Discharge Orders     None        Franchot Heidelberg, PA-C 11/20/20 1600    Wyvonnia Dusky, MD 11/20/20 1705

## 2020-11-20 NOTE — ED Triage Notes (Signed)
Pt presents with c/o vomiting that started today. Pt also reports he had a covid exposure last week. Pt reports that he also has some left flank pain, hx of kidney stone.

## 2020-11-20 NOTE — Discharge Instructions (Signed)
Your work-up today was overall reassuring.  Your labs are negative. Did not show any kidney stones in the tubes, however you do have stones in your kidneys which puts you at risk for having further passage of kidney stones. You are found to have multiple cysts on both kidneys.  This should be followed up with your primary care doctor. Your COVID test is pending. Return to the emergency room with any new, worsening, concerning symptoms

## 2020-11-21 LAB — SARS CORONAVIRUS 2 (TAT 6-24 HRS): SARS Coronavirus 2: NEGATIVE

## 2021-08-25 ENCOUNTER — Telehealth: Payer: Self-pay | Admitting: Oncology

## 2021-08-25 NOTE — Telephone Encounter (Signed)
Per 6/26 phone called and spoke to pt about appointment.  Per Dr Clelia Croft pt was scheduled in september.  Pt confirmed appointment

## 2021-10-20 ENCOUNTER — Emergency Department (HOSPITAL_COMMUNITY): Payer: BC Managed Care – PPO

## 2021-10-20 ENCOUNTER — Emergency Department (HOSPITAL_COMMUNITY)
Admission: EM | Admit: 2021-10-20 | Discharge: 2021-10-20 | Disposition: A | Discharge status: Home / Self Care | Payer: BC Managed Care – PPO | Attending: Emergency Medicine | Admitting: Emergency Medicine

## 2021-10-20 ENCOUNTER — Encounter: Payer: Self-pay | Admitting: Oncology

## 2021-10-20 ENCOUNTER — Other Ambulatory Visit: Payer: Self-pay

## 2021-10-20 ENCOUNTER — Encounter (HOSPITAL_COMMUNITY): Payer: Self-pay

## 2021-10-20 DIAGNOSIS — N200 Calculus of kidney: Secondary | ICD-10-CM

## 2021-10-20 DIAGNOSIS — I10 Essential (primary) hypertension: Secondary | ICD-10-CM | POA: Insufficient documentation

## 2021-10-20 DIAGNOSIS — R109 Unspecified abdominal pain: Secondary | ICD-10-CM | POA: Diagnosis present

## 2021-10-20 DIAGNOSIS — N132 Hydronephrosis with renal and ureteral calculous obstruction: Secondary | ICD-10-CM | POA: Insufficient documentation

## 2021-10-20 DIAGNOSIS — Z8546 Personal history of malignant neoplasm of prostate: Secondary | ICD-10-CM | POA: Diagnosis not present

## 2021-10-20 LAB — URINALYSIS, ROUTINE W REFLEX MICROSCOPIC
Bacteria, UA: NONE SEEN
Bilirubin Urine: NEGATIVE
Glucose, UA: NEGATIVE mg/dL
Ketones, ur: NEGATIVE mg/dL
Leukocytes,Ua: NEGATIVE
Nitrite: NEGATIVE
Protein, ur: 30 mg/dL — AB
RBC / HPF: >50 RBC/hpf — ABNORMAL HIGH (ref 0–5)
Specific Gravity, Urine: 1.024 (ref 1.005–1.030)
pH: 5 (ref 5.0–8.0)

## 2021-10-20 LAB — COMPREHENSIVE METABOLIC PANEL
ALT: 26 U/L (ref 0–44)
AST: 23 U/L (ref 15–41)
Albumin: 4.2 g/dL (ref 3.5–5.0)
Alkaline Phosphatase: 69 U/L (ref 38–126)
Anion gap: 12 (ref 5–15)
BUN: 15 mg/dL (ref 6–20)
CO2: 21 mmol/L — ABNORMAL LOW (ref 22–32)
Calcium: 8.8 mg/dL — ABNORMAL LOW (ref 8.9–10.3)
Chloride: 108 mmol/L (ref 98–111)
Creatinine, Ser: 1.09 mg/dL (ref 0.61–1.24)
GFR, Estimated: >60 mL/min (ref >60)
Glucose, Bld: 100 mg/dL — ABNORMAL HIGH (ref 70–99)
Potassium: 3.8 mmol/L (ref 3.5–5.1)
Sodium: 141 mmol/L (ref 135–145)
Total Bilirubin: 0.6 mg/dL (ref 0.3–1.2)
Total Protein: 7.1 g/dL (ref 6.5–8.1)

## 2021-10-20 LAB — CBC
HCT: 35.9 % — ABNORMAL LOW (ref 39.0–52.0)
Hemoglobin: 12.2 g/dL — ABNORMAL LOW (ref 13.0–17.0)
MCH: 33.4 pg (ref 26.0–34.0)
MCHC: 34 g/dL (ref 30.0–36.0)
MCV: 98.4 fL (ref 80.0–100.0)
Platelets: 241 10*3/uL (ref 150–400)
RBC: 3.65 MIL/uL — ABNORMAL LOW (ref 4.22–5.81)
RDW: 14.6 % (ref 11.5–15.5)
WBC: 8.1 10*3/uL (ref 4.0–10.5)
nRBC: 0 % (ref 0.0–0.2)

## 2021-10-20 LAB — LIPASE, BLOOD: Lipase: 23 U/L (ref 11–51)

## 2021-10-20 MED ORDER — ONDANSETRON 4 MG PO TBDP
4.0000 mg | ORAL_TABLET | Freq: Once | ORAL | Status: AC
  Administered 2021-10-20: 4 mg via ORAL
  Filled 2021-10-20: qty 1

## 2021-10-20 MED ORDER — KETOROLAC TROMETHAMINE 15 MG/ML IJ SOLN
15.0000 mg | Freq: Once | INTRAMUSCULAR | Status: DC
  Filled 2021-10-20: qty 1

## 2021-10-20 MED ORDER — KETOROLAC TROMETHAMINE 60 MG/2ML IM SOLN
60.0000 mg | Freq: Once | INTRAMUSCULAR | Status: AC
  Administered 2021-10-20: 60 mg via INTRAMUSCULAR
  Filled 2021-10-20: qty 2

## 2021-10-20 MED ORDER — OXYCODONE-ACETAMINOPHEN 5-325 MG PO TABS: 1.0000 | ORAL_TABLET | Freq: Four times a day (QID) | ORAL | 0 refills | Status: DC | PRN

## 2021-10-20 MED ORDER — ONDANSETRON HCL 4 MG PO TABS: 4.0000 mg | ORAL_TABLET | Freq: Four times a day (QID) | ORAL | 0 refills | Status: AC

## 2021-10-20 NOTE — ED Provider Notes (Addendum)
[place] EMERGENCY DEPARTMENT Provider Note   CSN: [id number] Arrival date & time: 10/20/21  1157     History  Chief Complaint  Patient presents with   Flank Pain    ADONNIS SALCEDA is a 61 y.o. male.  Patient is a 61 year old male with a history of kidney stones, hypertension, prostate cancer who is currently not taking medications chronically who presents today with sudden onset of right-sided flank pain that started at 2:00 this morning when he got to go to the bathroom.  He reports the pain was severe and was causing multiple episodes of vomiting.  He took 2 ibuprofen this morning and was able to go back to sleep.  He then woke up at 10 AM and had further pain and vomiting.  He did go to work and reports it was feeling a little bit better but then the pain returned and has been persistent.  He has not had fever or diarrhea.  He has noticed his urine looked a little bit dark.  He has no significant abdominal pain.  The history is provided by the patient.  Flank Pain       Home Medications Prior to Admission medications   Medication Sig Start Date End Date Taking? Authorizing Provider  ondansetron (ZOFRAN) 4 MG tablet Take 1 tablet (4 mg total) by mouth every 6 (six) hours. 10/20/21  Yes Blanchie Dessert, MD  oxyCODONE-acetaminophen (PERCOCET/ROXICET) 5-325 MG tablet Take 1 tablet by mouth every 6 (six) hours as needed for severe pain. 10/20/21  Yes Blanchie Dessert, MD  megestrol (MEGACE) 20 MG tablet Take 1 tablet (20 mg total) by mouth 2 (two) times daily. 08/04/19   Wyatt Portela, MD  ondansetron (ZOFRAN ODT) 4 MG disintegrating tablet Take 1 tablet (4 mg total) by mouth every 8 (eight) hours as needed for nausea or vomiting. 02/09/20   Carmin Muskrat, MD  predniSONE (DELTASONE) 5 MG tablet Take 1 tablet (5 mg total) by mouth daily with breakfast. 08/04/19   Wyatt Portela, MD  tadalafil (CIALIS) 20 MG tablet Take 20 mg by mouth as needed. 08/04/18    [provider]  tamsulosin (FLOMAX) 0.4 MG CAPS capsule Take 1 capsule (0.4 mg total) by mouth daily. 02/09/20   Carmin Muskrat, MD  ZYTIGA 250 MG tablet Take 4 tablets (1,000 mg total) by mouth daily. Take on an empty stomach 1 hour before or 2 hours after a meal 03/31/19   Wyatt Portela, MD      Allergies    Patient has no known allergies.    Review of Systems   Review of Systems  Genitourinary:  Positive for flank pain.    Physical Exam Updated Vital Signs BP (!) 175/80 (BP Location: Right Arm)   Pulse 62   Temp 98.1 F (36.7 C) (Oral)   Resp 17   Ht '5\' 6"'$  (1.676 m)   Wt 101.6 kg   SpO2 100%   BMI 36.15 kg/m  Physical Exam Vitals and nursing note reviewed.  Constitutional:      General: He is not in acute distress.    Appearance: He is well-developed.  HENT:     Head: Normocephalic and atraumatic.  Eyes:     Conjunctiva/sclera: Conjunctivae normal.     Pupils: Pupils are equal, round, and reactive to light.  Cardiovascular:     Rate and Rhythm: Normal rate and regular rhythm.     Heart sounds: No murmur heard. Pulmonary:  Effort: Pulmonary effort is normal. No respiratory distress.     Breath sounds: Normal breath sounds. No wheezing or rales.  Abdominal:     General: There is no distension.     Palpations: Abdomen is soft.     Tenderness: There is no abdominal tenderness. There is right CVA tenderness. There is no guarding or rebound.  Musculoskeletal:        General: No tenderness. Normal range of motion.     Cervical back: Normal range of motion and neck supple.  Skin:    General: Skin is warm and dry.     Findings: No erythema or rash.  Neurological:     Mental Status: He is alert and oriented to person, place, and time.  Psychiatric:        Behavior: Behavior normal.     ED Results / Procedures / Treatments   Labs (all labs ordered are listed, but only abnormal results are displayed) Labs Reviewed  COMPREHENSIVE METABOLIC PANEL -  Abnormal; Notable for the following components:      Result Value   CO2 21 (*)    Glucose, Bld 100 (*)    Calcium 8.8 (*)    All other components within normal limits  CBC - Abnormal; Notable for the following components:   RBC 3.65 (*)    Hemoglobin 12.2 (*)    HCT 35.9 (*)    All other components within normal limits  URINALYSIS, ROUTINE W REFLEX MICROSCOPIC - Abnormal; Notable for the following components:   APPearance HAZY (*)    Hgb urine dipstick LARGE (*)    Protein, ur 30 (*)    RBC / HPF >50 (*)    All other components within normal limits  LIPASE, BLOOD    EKG None  Radiology CT Renal Stone Study  Result Date: 10/20/2021 CLINICAL DATA:  Right flank pain.  History of nephrolithiasis. EXAM: CT ABDOMEN AND PELVIS WITHOUT CONTRAST TECHNIQUE: Multidetector CT imaging of the abdomen and pelvis was performed following the standard protocol without IV contrast. RADIATION DOSE REDUCTION: This exam was performed according to the departmental dose-optimization program which includes automated exposure control, adjustment of the mA and/or kV according to patient size and/or use of iterative reconstruction technique. COMPARISON:  11/20/2020 FINDINGS: Lower chest: Unremarkable Hepatobiliary: Stable simple cyst in the lateral segment left hepatic lobe. Otherwise unremarkable. Pancreas: Unremarkable Spleen: Unremarkable Adrenals/Urinary Tract: Adrenal glands unremarkable. Moderate to prominent right hydronephrosis and right hydroureter extending down to a 3 mm right distal ureteral stone, only about 9 mm from the right ureterovesical junction. There is associated perirenal and periureteral stranding as well as some notable fluid in the right perirenal space inferiorly, cannot completely exclude the possibility of caliceal rupture. Nonobstructive 5 mm right kidney lower pole calculus. Two nonobstructive calculi in the left kidney lower pole, larger measuring about 4 mm in long axis. Numerous  bilateral renal cystic lesions are again identified, these are incompletely evaluated on today's noncontrast assessment. Stomach/Bowel: Unremarkable Vascular/Lymphatic: Abdominal aortic atherosclerotic vascular calcification. Reproductive: Unremarkable Other: No supplemental non-categorized findings. Musculoskeletal: Lower thoracic spondylosis. IMPRESSION: 1. Obstructive 3 mm right distal ureteral calculus close to the UVJ. This is associated with hydronephrosis, hydroureter, and substantial perirenal fluid such that caliceal rupture is not excluded. 2. Additional bilateral nonobstructive nephrolithiasis. 3.  Aortic Atherosclerosis (ICD10-I70.0). 4. Numerous bilateral renal cystic lesions are incompletely evaluated on today's noncontrast assessment. If clinically warranted these could be fully characterized with dedicated renal protocol CT or MRI with and without contrast.  Electronically Signed   By: Van Clines M.D.   On: 10/20/2021 14:38    Procedures Procedures    Medications Ordered in ED Medications  ondansetron (ZOFRAN-ODT) disintegrating tablet 4 mg (4 mg Oral Given 10/20/21 1748)  ketorolac (TORADOL) injection 60 mg (60 mg Intramuscular Given 10/20/21 1749)    ED Course/ Medical Decision Making/ A&P                           Medical Decision Making Amount and/or Complexity of Data Reviewed Labs: ordered. Decision-making details documented in ED Course. Radiology: ordered and independent interpretation performed. Decision-making details documented in ED Course.  Risk Prescription drug management.   Pt with multiple medical problems and comorbidities and presenting today with a complaint that caries a high risk for morbidity and mortality.  Here today presenting with a sudden onset of flank pain.  Patient is noted to be hypertensive here with a prior history but reports he does not take blood pressure medication regularly.  May be related to pain.  He has no abdominal tenderness.   Lower suspicion for dissection or AAA.  Low suspicion for infection as patient is afebrile and denies any infectious symptoms.  Concern for renal stone based on abrupt nature of his pain and prior history of kidney stones.  I independently interpreted patient's labs today and UA consistent with large hemoglobin in the urine but no evidence of infection, lipase, CMP and CBC without acute findings. I have independently visualized and interpreted pt's images today.  CT renal today shows right-sided hydronephrosis and visible stone present in the distal ureter.  Radiology reports 3 mm right distal ureteral calculus close to the UVJ with associated hydronephrosis, hydroureter and substantial perirenal fluid such that caliceal can all rupture is not excluded.  Will give pain control but feel pt will pass spontaneously  7:00 PM Pain improved after IM toradol due to difficulty with access.          Final Clinical Impression(s) / ED Diagnoses Final diagnoses:  Kidney stone    Rx / DC Orders ED Discharge Orders          Ordered    oxyCODONE-acetaminophen (PERCOCET/ROXICET) 5-325 MG tablet  Every 6 hours PRN        10/20/21 1829    ondansetron (ZOFRAN) 4 MG tablet  Every 6 hours        10/20/21 1829              Blanchie Dessert, MD 10/20/21 Brien Mates, MD 10/20/21 1900

## 2021-10-20 NOTE — ED Provider Triage Note (Signed)
Emergency Medicine Provider Triage Evaluation Note  Charles [name] , a 61 y.o. male  was evaluated in triage.  Pt complains of waking up in the middle of the night with right flank pain, nausea.  Patient reports that since then pain has become constant.  Patient reports it feels somewhat similar to previous kidney stone.  He denies any blood in urine but does report some discoloration.  He denies any difficulty with urination.  Patient reports that he took some Advil with minimal relief of pain.  Patient denies any heavy lifting or other possible injury to back.  Patient did start taking some green tea diet pills recently and is wondering if this has anything to do with it.  Review of Systems  Positive: Flank pain, back pain Negative: Dysuria  Physical Exam  There were no vitals taken for this visit. Gen:   Awake, no distress   Resp:  Normal effort  MSK:   Moves extremities without difficulty  Other:  Tenderness to palpation right CVA, right abdomen extending into the suprapubic region, no rebound, rigidity, guarding.  Patient is not in any acute distress at this time.  Medical Decision Making  Medically screening exam initiated at 12:59 PM.  Appropriate orders placed.  Zella Richer Mahon was informed that the remainder of the evaluation will be completed by another provider, this initial triage assessment does not replace that evaluation, and the importance of remaining in the ED until their evaluation is complete.  Work-up initiated   Anselmo Pickler, PA-C 10/20/21 1300

## 2021-10-20 NOTE — ED Triage Notes (Signed)
Flank pain on right sided that started this am.

## 2021-10-20 NOTE — Discharge Instructions (Addendum)
Stay hydrated.  Return if you develop fever, vomiting pain does not.

## 2021-11-18 ENCOUNTER — Encounter: Payer: Self-pay | Admitting: Oncology

## 2021-11-18 ENCOUNTER — Telehealth: Payer: Self-pay | Admitting: Pharmacy Technician

## 2021-11-18 ENCOUNTER — Inpatient Hospital Stay: Payer: BC Managed Care – PPO | Attending: Oncology | Admitting: Oncology

## 2021-11-18 ENCOUNTER — Other Ambulatory Visit (HOSPITAL_COMMUNITY): Payer: Self-pay

## 2021-11-18 ENCOUNTER — Other Ambulatory Visit: Payer: Self-pay

## 2021-11-18 ENCOUNTER — Telehealth: Payer: Self-pay

## 2021-11-18 VITALS — BP 128/92 | HR 99 | Temp 97.9°F | Resp 16 | Ht 66.0 in | Wt 210.8 lb

## 2021-11-18 DIAGNOSIS — E876 Hypokalemia: Secondary | ICD-10-CM | POA: Insufficient documentation

## 2021-11-18 DIAGNOSIS — Z79899 Other long term (current) drug therapy: Secondary | ICD-10-CM | POA: Diagnosis not present

## 2021-11-18 DIAGNOSIS — I10 Essential (primary) hypertension: Secondary | ICD-10-CM | POA: Diagnosis not present

## 2021-11-18 DIAGNOSIS — C61 Malignant neoplasm of prostate: Secondary | ICD-10-CM | POA: Insufficient documentation

## 2021-11-18 MED ORDER — PREDNISONE 5 MG PO TABS: 5.0000 mg | ORAL_TABLET | Freq: Every day | ORAL | 3 refills | Status: DC

## 2021-11-18 MED ORDER — ZYTIGA 250 MG PO TABS
1000.0000 mg | ORAL_TABLET | Freq: Every day | ORAL | 1 refills | Status: DC
  Filled 2021-11-18: qty 120, 30d supply, fill #0

## 2021-11-18 NOTE — Telephone Encounter (Signed)
Oral Oncology Pharmacist Encounter  Received new prescription for abiraterone (Zytiga) for the treatment of castration-sensitive prostate cancer in conjunction with prednisone, planned duration until disease progression or unacceptable toxicity.  Labs from 10/20/21 assessed, no interventions needed.  Current medication list in Epic reviewed, DDIs with Zytiga identified: - tamsulosin: concentration may be increased by zytiga. Monitor therapy for increased side effects (ex: hypotension)  Evaluated chart and no patient barriers to medication adherence noted.   Patient agreement for treatment documented in MD note on 11/18/2021.  Prescription has been e-scribed to the [facility] for benefits analysis and approval.  Oral Oncology Clinic will continue to follow for insurance authorization, copayment issues, initial counseling and start date.  Drema Halon, PharmD Hematology/Oncology Clinical Pharmacist [facility] [phone] 11/18/2021 1:32 PM

## 2021-11-18 NOTE — Telephone Encounter (Signed)
Oral Oncology Patient Advocate Encounter   Received notification that prior authorization for Abiraterone is required.   PA submitted on 11/18/2021 Key [id number] Status is pending     Lady Deutscher, CPhT-Adv Oncology Pharmacy Patient [place] Direct Number: [phone]  Fax: [phone]

## 2021-11-18 NOTE — Progress Notes (Signed)
Hematology and Oncology Follow Up Visit  Charles [name] [id number] 1960/12/23 61 y.o. 11/18/2021 9:53 AM Patient, No Pcp PerNo ref. 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  He presented with PSA of 698 and a pelvic mass.   Prior Therapy: He is S/P left transgluteal pelvic mass biopsy on 08/28/2016.   Current therapy:  Lupron started in 2018 although he has not been receiving.  Zytiga 1000 mg daily with prednisone at 5 mg daily started in September 2018.  He has been off treatment for the last 2 years.  Interim History: Mr. Carattini returns today for a follow-up.  Since last visit, he has not followed up regularly and has not taken any treatment for his cancer for at least the last 2 years.  He feels well without any complications at this time.  He denies any nausea, vomiting or abdominal pain.  He was seen in the emergency department in August for a kidney stone.  He denies any pelvic discomfort or hematuria.    Medications: Updated on review. Current Outpatient Medications  Medication Sig Dispense Refill   megestrol (MEGACE) 20 MG tablet Take 1 tablet (20 mg total) by mouth 2 (two) times daily. 90 tablet 3   ondansetron (ZOFRAN ODT) 4 MG disintegrating tablet Take 1 tablet (4 mg total) by mouth every 8 (eight) hours as needed for nausea or vomiting. 20 tablet 0   ondansetron (ZOFRAN) 4 MG tablet Take 1 tablet (4 mg total) by mouth every 6 (six) hours. 12 tablet 0   oxyCODONE-acetaminophen (PERCOCET/ROXICET) 5-325 MG tablet Take 1 tablet by mouth every 6 (six) hours as needed for severe pain. 10 tablet 0   predniSONE (DELTASONE) 5 MG tablet Take 1 tablet (5 mg total) by mouth daily with breakfast. 30 tablet 3   tadalafil (CIALIS) 20 MG tablet Take 20 mg by mouth as needed.     tamsulosin (FLOMAX) 0.4 MG CAPS capsule Take 1 capsule (0.4 mg total) by mouth daily. 15 capsule 0   ZYTIGA 250 MG  tablet Take 4 tablets (1,000 mg total) by mouth daily. Take on an empty stomach 1 hour before or 2 hours after a meal 120 tablet 1   No current facility-administered medications for this visit.     Allergies: No Known Allergies   Physical Exam:  Blood pressure (!) 128/92, pulse 99, temperature 97.9 F (36.6 C), temperature source Temporal, resp. rate 16, height '5\' 6"'$  (1.676 m), weight 210 lb 12.8 oz (95.6 kg), SpO2 98 %.   ECOG: 0    General appearance: Comfortable appearing without any discomfort Head: Normocephalic without any trauma Oropharynx: Mucous membranes are moist and pink without any thrush or ulcers. Eyes: Pupils are equal and round reactive to light. Lymph nodes: No cervical, supraclavicular, inguinal or axillary lymphadenopathy.   Heart:regular rate and rhythm.  S1 and S2 without leg edema. Lung: Clear without any rhonchi or wheezes.  No dullness to percussion. Abdomin: Soft, nontender, nondistended with good bowel sounds.  No hepatosplenomegaly. Musculoskeletal: No joint deformity or effusion.  Full range of motion noted. Neurological: No deficits noted on motor, sensory and deep tendon reflex exam. Skin: No petechial rash or dryness.  Appeared moist.      Lab Results: Lab Results  Component Value Date   WBC 8.1 10/20/2021   HGB 12.2 (L) 10/20/2021   HCT 35.9 (L) 10/20/2021   MCV 98.4 10/20/2021   PLT 241 10/20/2021  Chemistry      Component Value Date/Time   NA 141 10/20/2021 1258   NA 144 03/03/2017 1253   K 3.8 10/20/2021 1258   K 3.6 03/03/2017 1253   CL 108 10/20/2021 1258   CO2 21 (L) 10/20/2021 1258   CO2 23 03/03/2017 1253   BUN 15 10/20/2021 1258   BUN 13.4 03/03/2017 1253   CREATININE 1.09 10/20/2021 1258   CREATININE 0.93 01/12/2020 1425   CREATININE 0.9 03/03/2017 1253      Component Value Date/Time   CALCIUM 8.8 (L) 10/20/2021 1258   CALCIUM 9.3 03/03/2017 1253   ALKPHOS 69 10/20/2021 1258   ALKPHOS 66 03/03/2017 1253    AST 23 10/20/2021 1258   AST 17 01/12/2020 1425   AST 15 03/03/2017 1253   ALT 26 10/20/2021 1258   ALT 16 01/12/2020 1425   ALT 17 03/03/2017 1253   BILITOT 0.6 10/20/2021 1258   BILITOT 0.7 01/12/2020 1425   BILITOT 0.87 03/03/2017 1253      IMPRESSION: 1. Obstructive 3 mm right distal ureteral calculus close to the UVJ. This is associated with hydronephrosis, hydroureter, and substantial perirenal fluid such that caliceal rupture is not excluded. 2. Additional bilateral nonobstructive nephrolithiasis. 3.  Aortic Atherosclerosis (ICD10-I70.0). 4. Numerous bilateral renal cystic lesions are incompletely evaluated on today's noncontrast assessment. If clinically warranted these could be fully characterized with dedicated renal protocol CT or MRI with and without contrast.  Impression and Plan:  61 year old with the following issues:   1.  Advanced prostate cancer diagnosed in 2018.  He presented with castration-sensitive disease.  The natural course of this disease was discussed and treatment choices were reviewed.  He has failed to show up for multiple years at this time with last PSA was up to 0.3.  CT scan obtained in August 2023 did not show any measurable disease in the pelvis.  Treatment options at this time including androgen receptor pathway inhibitors, systemic chemotherapy among others.  After discussion today, he is agreeable to resume Zytiga.  Complications that include hypertension, hypokalemia among others were reiterated.  At this discussion today, he is agreeable to proceed.  He understands without cancer treatment his disease will likely progress and will develop worsening symptoms.   2. Androgen depravation: Risks and benefits of restarting Eligard were discussed at this time.  Complications including hot flashes, sexual dysfunction and weight gain were reiterated.   3.  Hypokalemia: This will be monitored on Zytiga.  His potassium was normal on August 21.  4.   Liver function surveillance: LFTs were within normal range on August 21.  5.  Hypertension: His blood pressure is close to normal range and will continue to monitor on Zytiga.  6. Goals of care: Treatment is palliative although aggressive measures are warranted at this time.  7.  Follow-up: He will return next week to reinitiate Eligard and MD follow-up in 3 months.  30  minutes were spent on this encounter.  The time was dedicated to reviewing his disease status, treatment choices complications related to therapy.     Zola Button, MD 9/19/20239:53 AM

## 2021-11-19 ENCOUNTER — Other Ambulatory Visit: Payer: Self-pay | Admitting: *Deleted

## 2021-11-19 ENCOUNTER — Other Ambulatory Visit (HOSPITAL_COMMUNITY): Payer: Self-pay

## 2021-11-19 DIAGNOSIS — C61 Malignant neoplasm of prostate: Secondary | ICD-10-CM

## 2021-11-19 MED ORDER — ABIRATERONE ACETATE 250 MG PO TABS
1000.0000 mg | ORAL_TABLET | Freq: Every day | ORAL | 0 refills | Status: DC
  Filled 2021-11-19: qty 120, 30d supply, fill #0
  Filled 2021-11-19: qty 60, 15d supply, fill #0
  Filled 2021-12-26: qty 60, 15d supply, fill #1

## 2021-11-19 NOTE — Telephone Encounter (Signed)
Oral Oncology Patient Advocate Encounter  Prior Authorization for Abiraterone has been approved.    PA# [id number] Effective dates: 11/18/2021 through 11/17/2022  Patients co-pay is $49.95 for 15 day supply. Patient must fill 15 days at a time for first 4 fills per insurance.    Lady Deutscher, CPhT-Adv Oncology Pharmacy Patient [place] Direct Number: [phone]  Fax: [phone]

## 2021-11-19 NOTE — Telephone Encounter (Signed)
Oral Chemotherapy Pharmacist Encounter  I spoke with patient for overview of: Zytiga for the treatment of advanced, castration-sensitive prostate cancer in conjunction with prednisone, planned duration until disease progression or unacceptable toxicity. Patient was previously on zytiga and reported that he had no side effects during that time.   Counseled patient on administration, dosing, side effects, monitoring, drug-food interactions, safe handling, storage, and disposal.  Patient will take Zytiga '250mg'$  tablets, 4 tablets ('1000mg'$ ) by mouth once daily on an empty stomach, 1 hour before or 2 hours after a meal.  Patient will take prednisone '5mg'$  tablet, 1 tablet by mouth one daily with breakfast.  Zytiga start date: 11/21/2021  Adverse effects include but are not limited to: peripheral edema, GI upset, hypertension, hot flashes, fatigue, and arthralgias.    Prednisone prescription has been sent to [facility] on 11/18/2021. Patient will obtain prednisone and knows to start prednisone on the same day as Zytiga start.  Reviewed with patient importance of keeping a medication schedule and plan for any missed doses. No barriers to medication adherence identified.  Medication reconciliation performed and medication/allergy list updated. Patient states that he is no longer taking the Flomax.   Insurance authorization for Fabio Asa has been obtained. Test claim at the pharmacy revealed copayment $49.95 for 1st fill of 15 days per insurance. Patient will pick up medication from [facility] on Friday.   Patient informed the pharmacy will reach out 5-7 days prior to needing next fill of Zytiga to coordinate continued medication acquisition to prevent break in therapy.  All questions answered.  Mr. Ketchum voiced understanding and appreciation.   Medication education handout placed in mail for patient. Patient knows to call the office with questions or concerns. Oral  Chemotherapy Clinic phone number provided to patient.   Drema Halon, PharmD Hematology/Oncology Clinical Pharmacist [facility] [phone] 11/19/2021 11:42 AM

## 2021-11-20 ENCOUNTER — Other Ambulatory Visit (HOSPITAL_COMMUNITY): Payer: Self-pay

## 2021-11-24 ENCOUNTER — Telehealth: Payer: Self-pay | Admitting: Oncology

## 2021-11-24 NOTE — Telephone Encounter (Signed)
Calling patient regarding September and December appointments, I called patient multiple times and no answer nor was I able to leave a voicemail. Calender will be mailed.

## 2021-11-25 ENCOUNTER — Other Ambulatory Visit (HOSPITAL_COMMUNITY): Payer: Self-pay

## 2021-11-26 ENCOUNTER — Inpatient Hospital Stay: Payer: BC Managed Care – PPO

## 2021-12-11 ENCOUNTER — Other Ambulatory Visit (HOSPITAL_COMMUNITY): Payer: Self-pay

## 2021-12-11 ENCOUNTER — Inpatient Hospital Stay: Payer: BC Managed Care – PPO | Attending: Oncology

## 2021-12-11 VITALS — BP 141/89 | HR 85 | Temp 98.1°F | Resp 18

## 2021-12-11 DIAGNOSIS — Z5111 Encounter for antineoplastic chemotherapy: Secondary | ICD-10-CM | POA: Diagnosis present

## 2021-12-11 DIAGNOSIS — C61 Malignant neoplasm of prostate: Secondary | ICD-10-CM | POA: Insufficient documentation

## 2021-12-11 MED ORDER — LEUPROLIDE ACETATE (6 MONTH) 45 MG ~~LOC~~ KIT
45.0000 mg | PACK | Freq: Once | SUBCUTANEOUS | Status: AC
  Administered 2021-12-11: 45 mg via SUBCUTANEOUS
  Filled 2021-12-11: qty 45

## 2021-12-16 ENCOUNTER — Other Ambulatory Visit (HOSPITAL_COMMUNITY): Payer: Self-pay

## 2021-12-17 ENCOUNTER — Other Ambulatory Visit (HOSPITAL_COMMUNITY): Payer: Self-pay

## 2021-12-24 ENCOUNTER — Other Ambulatory Visit (HOSPITAL_COMMUNITY): Payer: Self-pay

## 2021-12-26 ENCOUNTER — Other Ambulatory Visit (HOSPITAL_COMMUNITY): Payer: Self-pay

## 2021-12-29 ENCOUNTER — Telehealth: Payer: Self-pay | Admitting: *Deleted

## 2021-12-29 NOTE — Telephone Encounter (Signed)
After hours phone call received.  Patient states that he got injection 2 weeks ago in his arm and he has a hard lump that still remains.  He is unsure of what medication it was as he used to get this medication in his stomach.    Attempted to follow up with patient, but mail box was full.

## 2021-12-31 ENCOUNTER — Other Ambulatory Visit (HOSPITAL_COMMUNITY): Payer: Self-pay

## 2022-01-20 ENCOUNTER — Other Ambulatory Visit (HOSPITAL_COMMUNITY): Payer: Self-pay

## 2022-01-23 ENCOUNTER — Other Ambulatory Visit (HOSPITAL_COMMUNITY): Payer: Self-pay

## 2022-02-03 ENCOUNTER — Other Ambulatory Visit (HOSPITAL_COMMUNITY): Payer: Self-pay

## 2022-02-05 ENCOUNTER — Other Ambulatory Visit (HOSPITAL_COMMUNITY): Payer: Self-pay

## 2022-02-09 ENCOUNTER — Other Ambulatory Visit (HOSPITAL_COMMUNITY): Payer: Self-pay

## 2022-02-18 ENCOUNTER — Telehealth: Payer: Self-pay | Admitting: Oncology

## 2022-02-18 ENCOUNTER — Inpatient Hospital Stay: Payer: BC Managed Care – PPO | Admitting: Oncology

## 2022-02-18 ENCOUNTER — Inpatient Hospital Stay: Payer: BC Managed Care – PPO

## 2022-02-18 NOTE — Telephone Encounter (Signed)
Called patient to r/s missed appointment. Left patient voicemail with new appointment information.

## 2022-02-27 ENCOUNTER — Telehealth: Payer: Self-pay | Admitting: Oncology

## 2022-02-27 ENCOUNTER — Inpatient Hospital Stay: Payer: BC Managed Care – PPO | Admitting: Oncology

## 2022-02-27 ENCOUNTER — Inpatient Hospital Stay: Payer: BC Managed Care – PPO

## 2022-02-27 NOTE — Telephone Encounter (Signed)
Per 12/29 IB, message left

## 2022-03-05 ENCOUNTER — Telehealth: Payer: Self-pay | Admitting: Oncology

## 2022-03-05 ENCOUNTER — Inpatient Hospital Stay: Payer: BC Managed Care – PPO | Admitting: Oncology

## 2022-03-05 ENCOUNTER — Other Ambulatory Visit (HOSPITAL_COMMUNITY): Payer: Self-pay

## 2022-03-05 ENCOUNTER — Inpatient Hospital Stay: Payer: BC Managed Care – PPO | Attending: Oncology

## 2022-03-05 ENCOUNTER — Telehealth: Payer: Self-pay | Admitting: *Deleted

## 2022-03-05 NOTE — Telephone Encounter (Signed)
have tried to call multiple times with no answer and VM is full, will not schedule until we hear from pt per 1/4 IB message

## 2022-03-05 NOTE — Telephone Encounter (Signed)
PC to patient regarding missed appointments today, no answer, unable to leave message, voice mailbox is full.

## 2022-03-20 ENCOUNTER — Telehealth: Payer: Self-pay | Admitting: Oncology

## 2022-03-20 NOTE — Telephone Encounter (Signed)
Called patient regarding providers departure, scheduled with new provider based providers last orders and diagnosis. Attempted to contact 3 times.

## 2022-03-23 ENCOUNTER — Other Ambulatory Visit: Payer: BC Managed Care – PPO

## 2022-03-23 ENCOUNTER — Ambulatory Visit: Payer: BC Managed Care – PPO | Admitting: Hematology and Oncology

## 2022-03-26 ENCOUNTER — Other Ambulatory Visit (HOSPITAL_COMMUNITY): Payer: Self-pay

## 2022-05-14 ENCOUNTER — Other Ambulatory Visit (HOSPITAL_COMMUNITY): Payer: Self-pay

## 2022-05-14 ENCOUNTER — Telehealth: Payer: Self-pay | Admitting: Hematology and Oncology

## 2022-05-14 NOTE — Telephone Encounter (Signed)
Per 3/14 Called patient to reschedule missed appointment from January, patient aware of date and time of appointment.

## 2022-05-21 ENCOUNTER — Encounter: Payer: Self-pay | Admitting: Oncology

## 2022-05-28 ENCOUNTER — Inpatient Hospital Stay: Payer: 59 | Attending: Oncology | Admitting: Hematology and Oncology

## 2022-05-28 ENCOUNTER — Other Ambulatory Visit: Payer: Self-pay | Admitting: Hematology and Oncology

## 2022-05-28 DIAGNOSIS — C61 Malignant neoplasm of prostate: Secondary | ICD-10-CM

## 2022-05-28 NOTE — Progress Notes (Deleted)
[place] Telephone:(336) [phone]   [phone]) [phone]  PROGRESS NOTE  Patient Care Team: Patient, No Pcp Per as PCP - General (General Practice) Michael Boston, MD as Consulting Physician (General Surgery) McKenzie, Candee Furbish, MD as Consulting Physician (Urology) Pyrtle, Lajuan Lines, MD as Consulting Physician (Gastroenterology) Wyatt Portela, MD as Consulting Physician (Oncology) Tyler Pita, MD as Consulting Physician (Radiation Oncology)  Hematological/Oncological History # ***  Interval History:  Charles [name] 62 y.o. male with medical history significant for *** presents for a follow up visit. The patient's last visit was on 11/18/2021 with Dr. Alen Blew. In the interim since the last visit ***  MEDICAL HISTORY:  Past Medical History:  Diagnosis Date   Blurred vision    Chronic kidney disease    renal cyst   Diverticulosis    Hemorrhoids    Hypertension    Morbid obesity ([place])    Prostate cancer ([place])    Rectal bleeding     SURGICAL HISTORY: Past Surgical History:  Procedure Laterality Date   CIRCUMCISION     needle core biopsy      SOCIAL HISTORY: Social History   Socioeconomic History   Marital status: Single    Spouse name: Not on file   Number of children: Not on file   Years of education: Not on file   Highest education level: Not on file  Occupational History   Not on file  Tobacco Use   Smoking status: Never   Smokeless tobacco: Never  Vaping Use   Vaping Use: Never used  Substance and Sexual Activity   Alcohol use: Yes    Alcohol/week: 2.0 standard drinks of alcohol    Types: 2 Cans of beer per week    Comment: daily use /2 24ouce cans   Drug use: No   Sexual activity: Yes  Other Topics Concern   Not on file  Social History Narrative   Not on file   Social Determinants of Health   Financial Resource Strain: Not on file  Food Insecurity: Not on file  Transportation Needs: Not on file  Physical Activity: Not on  file  Stress: Not on file  Social Connections: Not on file  Intimate Partner Violence: Not on file    FAMILY HISTORY: Family History  Problem Relation Age of Onset   Colon cancer Neg Hx    Esophageal cancer Neg Hx    Rectal cancer Neg Hx    Stomach cancer Neg Hx    Cancer Neg Hx     ALLERGIES:  has No Known Allergies.  MEDICATIONS:  Current Outpatient Medications  Medication Sig Dispense Refill   abiraterone acetate (ZYTIGA) 250 MG tablet Take 4 tablets (1,000 mg total) by mouth daily. Take on an empty stomach 1 hour before or 2 hours after a meal 120 tablet 0   megestrol (MEGACE) 20 MG tablet Take 1 tablet (20 mg total) by mouth 2 (two) times daily. (Patient not taking: Reported on 11/19/2021) 90 tablet 3   ondansetron (ZOFRAN ODT) 4 MG disintegrating tablet Take 1 tablet (4 mg total) by mouth every 8 (eight) hours as needed for nausea or vomiting. 20 tablet 0   ondansetron (ZOFRAN) 4 MG tablet Take 1 tablet (4 mg total) by mouth every 6 (six) hours. 12 tablet 0   oxyCODONE-acetaminophen (PERCOCET/ROXICET) 5-325 MG tablet Take 1 tablet by mouth every 6 (six) hours as needed for severe pain. 10 tablet 0   predniSONE (DELTASONE) 5 MG tablet Take 1 tablet (  5 mg total) by mouth daily with breakfast. 30 tablet 3   tadalafil (CIALIS) 20 MG tablet Take 20 mg by mouth as needed. (Patient not taking: Reported on 11/19/2021)     tamsulosin (FLOMAX) 0.4 MG CAPS capsule Take 1 capsule (0.4 mg total) by mouth daily. (Patient not taking: Reported on 11/19/2021) 15 capsule 0   No current facility-administered medications for this visit.    REVIEW OF SYSTEMS:   Constitutional: ( - ) fevers, ( - )  chills , ( - ) night sweats Eyes: ( - ) blurriness of vision, ( - ) double vision, ( - ) watery eyes Ears, nose, mouth, throat, and face: ( - ) mucositis, ( - ) sore throat Respiratory: ( - ) cough, ( - ) dyspnea, ( - ) wheezes Cardiovascular: ( - ) palpitation, ( - ) chest discomfort, ( - ) lower  extremity swelling Gastrointestinal:  ( - ) nausea, ( - ) heartburn, ( - ) change in bowel habits Skin: ( - ) abnormal skin rashes Lymphatics: ( - ) new lymphadenopathy, ( - ) easy bruising Neurological: ( - ) numbness, ( - ) tingling, ( - ) new weaknesses Behavioral/Psych: ( - ) mood change, ( - ) new changes  All other systems were reviewed with the patient and are negative.  PHYSICAL EXAMINATION: ECOG PERFORMANCE STATUS: {CHL ONC ECOG PS:[id number]}  There were no vitals filed for this visit. There were no vitals filed for this visit.  GENERAL: alert, no distress and comfortable SKIN: skin color, texture, turgor are normal, no rashes or significant lesions EYES: conjunctiva are pink and non-injected, sclera clear OROPHARYNX: no exudate, no erythema; lips, buccal mucosa, and tongue normal  NECK: supple, non-tender LYMPH:  no palpable lymphadenopathy in the cervical, axillary or inguinal LUNGS: clear to auscultation and percussion with normal breathing effort HEART: regular rate & rhythm and no murmurs and no lower extremity edema ABDOMEN: soft, non-tender, non-distended, normal bowel sounds Musculoskeletal: no cyanosis of digits and no clubbing  PSYCH: alert & oriented x 3, fluent speech NEURO: no focal motor/sensory deficits  LABORATORY DATA:  I have reviewed the data as listed    Latest Ref Rng & Units 10/20/2021   12:58 PM 11/20/2020   10:29 AM 02/09/2020   12:59 AM  CBC  WBC 4.0 - 10.5 K/uL 8.1  5.0  9.2   Hemoglobin 13.0 - 17.0 g/dL 12.2  12.5  12.0   Hematocrit 39.0 - 52.0 % 35.9  36.4  33.8   Platelets 150 - 400 K/uL 241  282  313        Latest Ref Rng & Units 10/20/2021   12:58 PM 11/20/2020   10:29 AM 02/09/2020   12:59 AM  CMP  Glucose 70 - 99 mg/dL 100  96  141   BUN 6 - 20 mg/dL 15  19  10    Creatinine 0.61 - 1.24 mg/dL 1.09  0.91  1.14   Sodium 135 - 145 mmol/L 141  142  142   Potassium 3.5 - 5.1 mmol/L 3.8  4.0  3.1   Chloride 98 - 111 mmol/L 108  108   109   CO2 22 - 32 mmol/L 21  25  20    Calcium 8.9 - 10.3 mg/dL 8.8  9.1  8.8   Total Protein 6.5 - 8.1 g/dL 7.1  7.5  6.8   Total Bilirubin 0.3 - 1.2 mg/dL 0.6  0.8  0.5   Alkaline Phos 38 - 126 U/L  69  85  63   AST 15 - 41 U/L 23  30  23    ALT 0 - 44 U/L 26  23  20      No results found for: "MPROTEIN" No results found for: "KPAFRELGTCHN", "LAMBDASER", "KAPLAMBRATIO"   BLOOD FILM: *** Review of the peripheral blood smear showed normal appearing white cells with neutrophils that were appropriately lobated and granulated. There was no predominance of bi-lobed or hyper-segmented neutrophils appreciated. No Dohle bodies were noted. There was no left shifting, immature forms or blasts noted. Lymphocytes remain normal in size without any predominance of large granular lymphocytes. Red cells show no anisopoikilocytosis, macrocytes , microcytes or polychromasia. There were no schistocytes, target cells, echinocytes, acanthocytes, dacrocytes, or stomatocytes.There was no rouleaux formation, nucleated red cells, or intra-cellular inclusions noted. The platelets are normal in size, shape, and color without any clumping evident.  RADIOGRAPHIC STUDIES: I have personally reviewed the radiological images as listed and agreed with the findings in the report. No results found.  ASSESSMENT & PLAN ***  No orders of the defined types were placed in this encounter.   All questions were answered. The patient knows to call the clinic with any problems, questions or concerns.  A total of more than 40 minutes were spent on this encounter with face-to-face time and non-face-to-face time, including preparing to see the patient, ordering tests and/or medications, counseling the patient and coordination of care as outlined above.   Ledell Peoples, MD Department of Hematology/Oncology [facility] Phone: [phone] Pager: [phone] Email:  [email or web address]  05/28/2022 7:49 AM

## 2022-06-09 ENCOUNTER — Ambulatory Visit: Payer: 59 | Admitting: Family Medicine

## 2022-06-09 ENCOUNTER — Telehealth: Payer: Self-pay | Admitting: Family Medicine

## 2022-06-09 NOTE — Telephone Encounter (Signed)
4.9.24 new pt now show

## 2022-06-09 NOTE — Telephone Encounter (Signed)
I did not send a no show letter but blocked pt from rescheduling at our office with all providers

## 2022-07-22 ENCOUNTER — Telehealth: Payer: Self-pay | Admitting: Hematology and Oncology

## 2022-07-23 ENCOUNTER — Telehealth: Payer: Self-pay | Admitting: Hematology and Oncology

## 2022-07-28 ENCOUNTER — Other Ambulatory Visit (HOSPITAL_COMMUNITY): Payer: Self-pay

## 2022-08-12 ENCOUNTER — Other Ambulatory Visit: Payer: Self-pay | Admitting: *Deleted

## 2022-08-12 ENCOUNTER — Other Ambulatory Visit: Payer: Self-pay

## 2022-08-12 ENCOUNTER — Telehealth: Payer: Self-pay | Admitting: Pharmacy Technician

## 2022-08-12 ENCOUNTER — Inpatient Hospital Stay: Payer: 59

## 2022-08-12 ENCOUNTER — Inpatient Hospital Stay: Payer: 59 | Attending: Oncology | Admitting: Hematology and Oncology

## 2022-08-12 ENCOUNTER — Telehealth: Payer: Self-pay | Admitting: Pharmacist

## 2022-08-12 ENCOUNTER — Encounter: Payer: Self-pay | Admitting: Oncology

## 2022-08-12 ENCOUNTER — Other Ambulatory Visit (HOSPITAL_COMMUNITY): Payer: Self-pay

## 2022-08-12 ENCOUNTER — Ambulatory Visit: Payer: 59

## 2022-08-12 VITALS — BP 141/88 | HR 80 | Temp 97.4°F | Resp 16 | Wt 212.2 lb

## 2022-08-12 DIAGNOSIS — C61 Malignant neoplasm of prostate: Secondary | ICD-10-CM | POA: Diagnosis present

## 2022-08-12 DIAGNOSIS — Z79899 Other long term (current) drug therapy: Secondary | ICD-10-CM | POA: Insufficient documentation

## 2022-08-12 DIAGNOSIS — Z0000 Encounter for general adult medical examination without abnormal findings: Secondary | ICD-10-CM

## 2022-08-12 DIAGNOSIS — Z5111 Encounter for antineoplastic chemotherapy: Secondary | ICD-10-CM | POA: Diagnosis present

## 2022-08-12 LAB — CBC WITH DIFFERENTIAL (CANCER CENTER ONLY)
Abs Immature Granulocytes: 0.02 10*3/uL (ref 0.00–0.07)
Basophils Absolute: 0 10*3/uL (ref 0.0–0.1)
Basophils Relative: 0 %
Eosinophils Absolute: 0.1 10*3/uL (ref 0.0–0.5)
Eosinophils Relative: 2 %
HCT: 36.7 % — ABNORMAL LOW (ref 39.0–52.0)
Hemoglobin: 12.4 g/dL — ABNORMAL LOW (ref 13.0–17.0)
Immature Granulocytes: 0 %
Lymphocytes Relative: 24 %
Lymphs Abs: 1.9 10*3/uL (ref 0.7–4.0)
MCH: 32.9 pg (ref 26.0–34.0)
MCHC: 33.8 g/dL (ref 30.0–36.0)
MCV: 97.3 fL (ref 80.0–100.0)
Monocytes Absolute: 0.6 10*3/uL (ref 0.1–1.0)
Monocytes Relative: 8 %
Neutro Abs: 5.3 10*3/uL (ref 1.7–7.7)
Neutrophils Relative %: 66 %
Platelet Count: 325 10*3/uL (ref 150–400)
RBC: 3.77 MIL/uL — ABNORMAL LOW (ref 4.22–5.81)
RDW: 12.6 % (ref 11.5–15.5)
WBC Count: 8 10*3/uL (ref 4.0–10.5)
nRBC: 0 % (ref 0.0–0.2)

## 2022-08-12 LAB — CMP (CANCER CENTER ONLY)
ALT: 17 U/L (ref 0–44)
AST: 18 U/L (ref 15–41)
Albumin: 4.2 g/dL (ref 3.5–5.0)
Alkaline Phosphatase: 78 U/L (ref 38–126)
Anion gap: 6 (ref 5–15)
BUN: 19 mg/dL (ref 8–23)
CO2: 29 mmol/L (ref 22–32)
Calcium: 9.7 mg/dL (ref 8.9–10.3)
Chloride: 108 mmol/L (ref 98–111)
Creatinine: 0.86 mg/dL (ref 0.61–1.24)
GFR, Estimated: >60 mL/min (ref >60)
Glucose, Bld: 105 mg/dL — ABNORMAL HIGH (ref 70–99)
Potassium: 4.3 mmol/L (ref 3.5–5.1)
Sodium: 143 mmol/L (ref 135–145)
Total Bilirubin: 0.4 mg/dL (ref 0.3–1.2)
Total Protein: 7.7 g/dL (ref 6.5–8.1)

## 2022-08-12 MED ORDER — SILDENAFIL CITRATE 50 MG PO TABS
50.0000 mg | ORAL_TABLET | Freq: Every day | ORAL | 0 refills | Status: DC | PRN
  Filled 2022-08-12: qty 30, 30d supply, fill #0

## 2022-08-12 MED ORDER — TADALAFIL 20 MG PO TABS
20.0000 mg | ORAL_TABLET | ORAL | 3 refills | Status: DC | PRN
  Filled 2022-08-12: qty 10, fill #0

## 2022-08-12 MED ORDER — LEUPROLIDE ACETATE (6 MONTH) 45 MG ~~LOC~~ KIT
45.0000 mg | PACK | Freq: Once | SUBCUTANEOUS | Status: AC
  Administered 2022-08-12: 45 mg via SUBCUTANEOUS
  Filled 2022-08-12: qty 45

## 2022-08-12 MED ORDER — ABIRATERONE ACETATE 250 MG PO TABS: 1000.0000 mg | ORAL_TABLET | Freq: Every day | ORAL | 3 refills | Status: DC

## 2022-08-12 MED ORDER — PREDNISONE 5 MG PO TABS
5.0000 mg | ORAL_TABLET | Freq: Every day | ORAL | 3 refills | Status: DC
  Filled 2022-08-12 (×2): qty 30, 30d supply, fill #0

## 2022-08-12 MED ORDER — ABIRATERONE ACETATE 250 MG PO TABS
1000.0000 mg | ORAL_TABLET | Freq: Every day | ORAL | 3 refills | Status: DC
  Filled 2022-08-12: qty 120, 30d supply, fill #0

## 2022-08-12 MED ORDER — PREDNISONE 5 MG PO TABS: 5.0000 mg | ORAL_TABLET | Freq: Every day | ORAL | 3 refills | Status: DC

## 2022-08-12 NOTE — Telephone Encounter (Signed)
Oral Oncology Pharmacist Encounter   Patient's insurance requires them to fill through [facility]. Zytiga prescription (and prednisone) have been redirected for dispensing.    Oral Chemotherapy Clinic will follow up with outside pharmacy to ensure Rx is delivered.    Lenord Carbo, PharmD, BCPS, [facility] Hematology/Oncology Clinical Pharmacist [facility] and [facility] Oral Chemotherapy Navigation Clinics [phone] 08/12/2022 1:45 PM

## 2022-08-12 NOTE — Telephone Encounter (Signed)
Oral Oncology Patient Advocate Encounter  Prior Authorization for abiraterone has been approved.    PA# [id number] Effective dates: 08/12/22 through 08/12/23  [facility] must [facility].    Jinger Neighbors, CPhT-Adv Oncology Pharmacy Patient Advocate [facility] Direct Number: [phone]  Fax: [phone]

## 2022-08-12 NOTE — Progress Notes (Signed)
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 Clelia Croft. Had been off Zytiga x 2 years. Restarted at that time.   Interval History:  Charles [name] 62 y.o. male with medical history significant for castrate sensitive prostate cancer who presents for a follow up visit. The patient's last visit was on 11/18/2021 with Dr. Clelia Croft. In the interim since the last visit [name] has not been on Zytiga therapy and has not received a Eligard shot.  On exam today Charles [name] thinks and reports [name] has been off Zytiga and they were not able to refill the medication because [name] could not come in for a clinic visit here.  [name] reports [name] is not able to come into a clinic visit because of his work schedule.  [name] notes overall [name] feels fine and has not had any issues in the interim since our last visit.  [name] reports [name] is not having any pain anywhere.  His weight is about 212 pounds though [name] notes [name] would like to lose weight.  [name] notes [name] does enjoy drinking beer and tequila and will drink "a couple of [place] beers per weeks and 2-3 shots of tequila per week).  [name] notes that when [name] was previously on Zytiga [name] did not have any trouble with the medication.  His energy levels are good.  [name] notes that his "sex life is still strong" I would like a refill on ED  medications.  [name] reports that Cialis was not helping him as much as [name] would like and [name] would prefer to try Viagra.  [name] is also interested in establishing with a primary care doctor.  [name] otherwise denies any fevers, chills, sweats, nausea, vomiting or diarrhea.  A full 10 point ROS is otherwise negative.  MEDICAL HISTORY:  Past Medical History:  Diagnosis Date   Blurred vision    Chronic kidney disease    renal cyst   Diverticulosis    Hemorrhoids    Hypertension    Morbid obesity ([facility])    Prostate cancer ([facility])    Rectal bleeding     SURGICAL HISTORY: Past Surgical History:  Procedure Laterality Date   CIRCUMCISION     needle core biopsy      SOCIAL HISTORY: Social History   Socioeconomic History   Marital status: Single    Spouse name: Not on file   Number of children: Not on file   Years of education: Not on file   Highest education level: Not on file  Occupational History   Not on file  Tobacco Use   Smoking status: Never   Smokeless tobacco: Never  Vaping Use   Vaping Use: Never used  Substance and Sexual Activity   Alcohol use: Yes    Alcohol/week: 2.0 standard drinks of alcohol    Types: 2 Cans of beer per week    Comment: daily use /2 24ouce cans   Drug use: No   Sexual activity: Yes  Other Topics Concern   Not on file  Social History Narrative   Not on file   Social Determinants of Health   Financial Resource Strain: Not on file  Food Insecurity: Not on file  Transportation Needs: Not on file  Physical Activity: Not on file  Stress: Not on file  Social Connections: Not on file  Intimate Partner Violence: Not on file    FAMILY HISTORY: Family History  Problem Relation Age of Onset   Colon cancer Neg Hx    Esophageal cancer Neg Hx    Rectal cancer Neg Hx    Stomach cancer Neg Hx    Cancer Neg Hx     ALLERGIES:  has No Known Allergies.  MEDICATIONS:  Current Outpatient Medications  Medication Sig Dispense Refill   sildenafil (VIAGRA)  50 MG tablet Take 1 tablet (50 mg) by mouth daily as needed for erectile dysfunction. 30 tablet 0   abiraterone acetate (ZYTIGA) 250 MG tablet Take 4 tablets (1,000 mg total) by mouth daily. Take on an empty stomach 1 hour before or 2 hours after a meal 120 tablet 3   ondansetron (ZOFRAN) 4 MG tablet Take 1 tablet (4 mg total) by mouth every 6 (six) hours. 12 tablet 0   predniSONE (DELTASONE) 5 MG tablet Take 1 tablet (5 mg) by mouth daily with breakfast. 30 tablet 3   No current facility-administered medications for this visit.    REVIEW OF SYSTEMS:   Constitutional: ( - ) fevers, ( - )  chills , ( - ) night sweats Eyes: ( - ) blurriness of vision, ( - ) double vision, ( - ) watery eyes Ears, nose, mouth, throat, and face: ( - ) mucositis, ( - ) sore throat Respiratory: ( - ) cough, ( - ) dyspnea, ( - ) wheezes Cardiovascular: ( - ) palpitation, ( - ) chest discomfort, ( - ) lower extremity swelling Gastrointestinal:  ( - ) nausea, ( - ) heartburn, ( - ) change in bowel habits Skin: ( - ) abnormal skin rashes Lymphatics: ( - ) new lymphadenopathy, ( - ) easy bruising Neurological: ( - ) numbness, ( - ) tingling, ( - ) new weaknesses Behavioral/Psych: ( - ) mood change, ( - ) new changes  All other systems were reviewed with the patient and are negative.  PHYSICAL EXAMINATION:  Vitals:   08/12/22 0947  BP: (!) 141/88  Pulse: 80  Resp: 16  Temp: (!) 97.4 F (36.3 C)  SpO2: 100%   Filed Weights   08/12/22 0947  Weight: 212 lb 3.2 oz (96.3 kg)    GENERAL: Well-appearing middle-aged African-American male, alert, no distress and comfortable SKIN: skin color, texture, turgor are normal, no rashes or significant lesions EYES: conjunctiva are pink and non-injected, sclera clear LUNGS: clear to auscultation and percussion with normal breathing effort HEART: regular rate & rhythm and no murmurs and no lower extremity edema Musculoskeletal: no cyanosis of digits and no clubbing   PSYCH: alert & oriented x 3, fluent speech NEURO: no focal motor/sensory deficits  LABORATORY DATA:  I have reviewed the data as listed    Latest Ref Rng & Units 08/12/2022    8:57 AM 10/20/2021   12:58 PM 11/20/2020   10:29 AM  CBC  WBC 4.0 - 10.5 K/uL 8.0  8.1  5.0   Hemoglobin 13.0 - 17.0 g/dL 16.1  09.6  04.5   Hematocrit 39.0 - 52.0 % 36.7  35.9  36.4   Platelets 150 -  400 K/uL 325  241  282        Latest Ref Rng & Units 08/12/2022    8:57 AM 10/20/2021   12:58 PM 11/20/2020   10:29 AM  CMP  Glucose 70 - 99 mg/dL 161  096  96   BUN 8 - 23 mg/dL 19  15  19    Creatinine 0.61 - 1.24 mg/dL 0.45  4.09  8.11   Sodium 135 - 145 mmol/L 143  141  142   Potassium 3.5 - 5.1 mmol/L 4.3  3.8  4.0   Chloride 98 - 111 mmol/L 108  108  108   CO2 22 - 32 mmol/L 29  21  25    Calcium 8.9 - 10.3 mg/dL 9.7  8.8  9.1   Total Protein 6.5 - 8.1 g/dL 7.7  7.1  7.5   Total Bilirubin 0.3 - 1.2 mg/dL 0.4  0.6  0.8   Alkaline Phos 38 - 126 U/L 78  69  85   AST 15 - 41 U/L 18  23  30    ALT 0 - 44 U/L 17  26  23      RADIOGRAPHIC STUDIES: No results found.  ASSESSMENT & PLAN Charles [name] 62 y.o. male with medical history significant for castrate sensitive prostate cancer who presents for a follow up visit.  # Castrate Sensitive Prostate Cancer --last dose of Eligard on 12/11/2021 at 45 mg (6 month dose). Currently due to repeat injection.  -- Today we will obtain new baseline labs to include CMP, CBC, and PSA -- If the PSA is elevated from prior would need to pursue a new baseline set of imaging.  Consider PSMA scan versus CT chest abdomen pelvis -- Will restart patient's Zytiga 1000 mg p.o. daily with prednisone 5 mg daily.  This was called into [facility] pharmacy -- Plan to have the patient return to clinic in 3 months time or sooner if there are concerning findings in the above lab work/imaging.  #ED -- Note Cialis 20 mg is not working for him, has requested Viagra 50 milligrams p.o.  as needed.  Called in this medication to [facility] for him.  # Healthcare Maintenance -- Patient has requested a referral to her primary care.  Will make referral to [facility] PCP  Orders Placed This Encounter  Procedures   Ambulatory referral to Internal Medicine    Referral Priority:   Routine    Referral Type:   Consultation    Referral Reason:   Specialty Services Required    Requested Specialty:   Internal Medicine    Number of Visits Requested:   1    All questions were answered. The patient knows to call the clinic with any problems, questions or concerns.  A total of more than 40 minutes were spent on this encounter with face-to-face time and non-face-to-face time, including preparing to see the patient, ordering tests and/or medications, counseling the patient and coordination of care as outlined above.   Ulysees Barns, MD Department of Hematology/Oncology [facility] at [facility] Phone: [phone] Pager: [phone] Email: [email or web address]  08/12/2022 1:51 PM

## 2022-08-12 NOTE — Telephone Encounter (Signed)
Oral Oncology Patient Advocate Encounter   Received notification that prior authorization for abiraterone is required.   PA submitted on 08/12/22 Key [id number] Status is pending     Jinger Neighbors, CPhT-Adv Oncology Pharmacy Patient Advocate [facility] Direct Number: [phone]  Fax: [phone]

## 2022-08-12 NOTE — Telephone Encounter (Signed)
Oral Oncology Pharmacist Encounter  Received new prescription for Zytiga (abiraterone) for the treatment of metastatic castration sensitive prostate cancer in conjunction with prednisone and ADT, planned duration until disease progression or unacceptable drug toxicity. Of note, patient has previously been on this therapy in 2018, and then again restarted in September 2023, but patient last filled 12/31/21 for 15 day supply.  CBC w/ Diff and CMP from 08/12/22 assessed, no relevant lab abnormalities requiring baseline dose adjustment required at this time. Prescription dose and frequency assessed for appropriateness.  Current medication list in Epic reviewed, no relevant/significant DDIs with Zytiga identified.  Evaluated chart and no patient barriers to medication adherence noted.   Prescription has been e-scribed to the [facility] for benefits analysis and approval.  Oral Oncology Clinic will continue to follow for insurance authorization, copayment issues, initial counseling and start date.  Charles [name], PharmD, BCPS, BCOP Hematology/Oncology Clinical Pharmacist [facility] and [facility] Oral Chemotherapy Navigation Clinics [phone] 08/12/2022 10:20 AM

## 2022-08-12 NOTE — Patient Instructions (Signed)
Leuprolide Suspension for Injection (Prostate Cancer) What is this medication? LEUPROLIDE (loo PROE lide) reduces the symptoms of prostate cancer. It works by decreasing levels of the hormone testosterone in the body. This prevents prostate cancer cells from spreading or growing. This medicine may be used for other purposes; ask your health care provider or pharmacist if you have questions. COMMON BRAND NAME(S): Eligard, Lupron Depot, Lupron Depot-Ped, Lutrate Depot, Viadur What should I tell my care team before I take this medication? They need to know if you have any of these conditions: Diabetes Heart disease Heart failure High or low levels of electrolytes, such as magnesium, potassium, or sodium in your blood Irregular heartbeat or rhythm Seizures An unusual or allergic reaction to leuprolide, other medications, foods, dyes, or preservatives Pregnant or trying to get pregnant Breast-feeding How should I use this medication? This medication is injected under the skin or into a muscle. It is given by your care team in a hospital or clinic setting. Talk to your care team about the use of this medication in children. Special care may be needed. Overdosage: If you think you have taken too much of this medicine contact a poison control center or emergency room at once. NOTE: This medicine is only for you. Do not share this medicine with others. What if I miss a dose? Keep appointments for follow-up doses. It is important not to miss your dose. Call your care team if you are unable to keep an appointment. What may interact with this medication? Do not take this medication with any of the following: Cisapride Dronedarone Ketoconazole Levoketoconazole Pimozide Thioridazine This medication may also interact with the following: Other medications that cause heart rhythm changes This list may not describe all possible interactions. Give your health care provider a list of all the medicines,  herbs, non-prescription drugs, or dietary supplements you use. Also tell them if you smoke, drink alcohol, or use illegal drugs. Some items may interact with your medicine. What should I watch for while using this medication? Visit your care team for regular checks on your progress. Tell your care team if your symptoms do not start to get better or if they get worse. This medication may increase blood sugar. The risk may be higher in patients who already have diabetes. Ask your care team what you can do to lower the risk of diabetes while taking this medication. This medication may cause infertility. Talk to your care team if you are concerned about your fertility. Heart attacks and strokes have been reported with the use of this medication. Get emergency help if you develop signs or symptoms of a heart attack or stroke. Talk to your care team about the risks and benefits of this medication. What side effects may I notice from receiving this medication? Side effects that you should report to your care team as soon as possible: Allergic reactions--skin rash, itching, hives, swelling of the face, lips, tongue, or throat Heart attack--pain or tightness in the chest, shoulders, arms, or jaw, nausea, shortness of breath, cold or clammy skin, feeling faint or lightheaded Heart rhythm changes--fast or irregular heartbeat, dizziness, feeling faint or lightheaded, chest pain, trouble breathing High blood sugar (hyperglycemia)--increased thirst or amount of urine, unusual weakness or fatigue, blurry vision Mood swings, irritability, hostility Seizures Stroke--sudden numbness or weakness of the face, arm, or leg, trouble speaking, confusion, trouble walking, loss of balance or coordination, dizziness, severe headache, change in vision Thoughts of suicide or self-harm, worsening mood, feelings of depression Side   effects that usually do not require medical attention (report to your care team if they continue or  are bothersome): Bone pain Change in sex drive or performance General discomfort and fatigue Hot flashes Muscle pain Pain, redness, or irritation at injection site Swelling of the ankles, hands, or feet This list may not describe all possible side effects. Call your doctor for medical advice about side effects. You may report side effects to FDA at 1-800-FDA-1088. Where should I keep my medication? This medication is given in a hospital or clinic. It will not be stored at home. NOTE: This sheet is a summary. It may not cover all possible information. If you have questions about this medicine, talk to your doctor, pharmacist, or health care provider.  2024 Elsevier/Gold Standard (2021-04-30 00:00:00)  

## 2022-08-13 LAB — PROSTATE-SPECIFIC AG, SERUM (LABCORP): Prostate Specific Ag, Serum: 1.7 ng/mL (ref 0.0–4.0)

## 2022-08-14 NOTE — Telephone Encounter (Signed)
Oral Chemotherapy Pharmacist Encounter   Called and spoke with representative, Shawna Orleans, with [facility] to check on status of Zytiga for patient. Per representative patient has a $0 copay and they have attempted to call patient, but have not been able to reach him.   I attempted to reach patient to provide update and offer for initial counseling on oral medication: Zytiga (abiraterone acetate).   No answer. Left voicemail for patient to call [facility] at [phone] to set up shipment of medication. Also provided my phone number for patient to call back to discuss details of medication acquisition and initial counseling session.  Lenord Carbo, PharmD, BCPS, [facility] Hematology/Oncology Clinical Pharmacist [facility] and [facility] Oral Chemotherapy Navigation Clinics [phone] 08/14/2022 9:26 AM

## 2022-08-17 ENCOUNTER — Other Ambulatory Visit (HOSPITAL_COMMUNITY): Payer: Self-pay

## 2022-08-17 NOTE — Telephone Encounter (Signed)
Oral Chemotherapy Pharmacist Encounter   Spoke with patient today to follow up regarding patient's oral chemotherapy medication: Zytiga (abiraterone acetate)  Patient states he has not set up shipment yet with [facility] because he wanted to also fill his prednisone through their pharmacy as well. Informed patient it appears that the first dispense of prednisone was from the [facility] and advised him to double check his prescription bag he picked up on 08/12/22. Patient states he will look this afternoon when he gets off work.   I will follow up with patient on 6/18 to confirm he found prednisone and ensure he sets up Zytiga shipment with [facility].   Lenord Carbo, PharmD, BCPS, [facility] Hematology/Oncology Clinical Pharmacist [facility] and [facility] Oral Chemotherapy Navigation Clinics [phone] 08/17/2022 12:43 PM

## 2022-08-18 NOTE — Telephone Encounter (Signed)
Oral Chemotherapy Pharmacist Encounter   Called to follow back up with patient if he was able to find the prednisone that had been dispensed from the [facility] on 08/12/22.  No answer. Left voicemail with patient to call back. Also called [facility] and confirmed patient has still not set up shipment with them. Per representative she was going to send another text reminder to patient to set up shipment.   Lenord Carbo, PharmD, BCPS, [facility] Hematology/Oncology Clinical Pharmacist [facility] and [facility] Oral Chemotherapy Navigation Clinics [phone] 08/18/2022 11:58 AM

## 2022-08-19 NOTE — Telephone Encounter (Signed)
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   Counseled patient on administration, dosing, side effects, monitoring, drug-food interactions, safe handling, storage, and disposal.  Patient will take Zytiga 250mg  tablets, 4 tablets (1000mg ) by mouth once daily on an empty stomach, 1 hour before or 2 hours after a meal.  Patient will take prednisone 5mg  tablet, 1 tablet by mouth one daily with breakfast. Patient confirmed   Zytiga start date: patient will start once he sets up shipment with [facility] (telephone number provided to him again, [phone]). Patient will start Zytiga once he receives it from [facility] (anticipate by the end of this week)  Adverse effects include but are not limited to: peripheral edema, GI upset, hypertension, hot flashes, fatigue, and arthralgias.    Reviewed with patient importance of keeping a medication schedule and plan for any missed doses. No barriers to medication adherence identified.  Medication reconciliation performed and medication/allergy list updated.  All questions answered.  Charles [name] voiced understanding and appreciation.   Medication education handout placed in mail for patient. Patient knows to call the office with questions or concerns. Oral Chemotherapy Clinic phone number provided to patient.   Charles [name], PharmD, BCPS, [facility] Hematology/Oncology Clinical Pharmacist [facility] and [facility] Oral Chemotherapy Navigation Clinics [phone] 08/19/2022 10:30 AM

## 2022-08-26 ENCOUNTER — Telehealth: Payer: Self-pay | Admitting: General Practice

## 2022-08-26 ENCOUNTER — Ambulatory Visit: Payer: 59 | Admitting: Internal Medicine

## 2022-08-26 NOTE — Telephone Encounter (Signed)
6.26.24 no show new pt. Pt block for future appt schedule . I did not send letter

## 2022-09-07 ENCOUNTER — Telehealth: Payer: Self-pay | Admitting: Hematology and Oncology

## 2022-09-07 ENCOUNTER — Other Ambulatory Visit: Payer: Self-pay | Admitting: Hematology and Oncology

## 2022-09-07 DIAGNOSIS — C61 Malignant neoplasm of prostate: Secondary | ICD-10-CM

## 2022-10-30 ENCOUNTER — Encounter: Payer: Self-pay | Admitting: Oncology

## 2022-11-06 ENCOUNTER — Telehealth: Payer: Self-pay | Admitting: Hematology and Oncology

## 2022-11-06 ENCOUNTER — Other Ambulatory Visit: Payer: Self-pay | Admitting: Hematology and Oncology

## 2022-11-06 ENCOUNTER — Inpatient Hospital Stay: Payer: No Typology Code available for payment source

## 2022-11-06 ENCOUNTER — Inpatient Hospital Stay: Payer: No Typology Code available for payment source | Admitting: Hematology and Oncology

## 2022-11-06 DIAGNOSIS — C61 Malignant neoplasm of prostate: Secondary | ICD-10-CM

## 2022-11-06 NOTE — Progress Notes (Unsigned)
Rescheduled per patient request.

## 2022-11-07 ENCOUNTER — Encounter: Payer: Self-pay | Admitting: Oncology

## 2022-11-29 NOTE — Progress Notes (Unsigned)
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 Clelia Croft. Had been off Zytiga x 2 years. Restarted at that time.   Interval History:  Charles [name] 62 y.o. male with medical history significant for castrate sensitive prostate cancer who presents for a follow up visit. The patient's last visit was on 08/12/2022. In the interim since the last visit he has had no major changes in his health.  On exam today Charles [name] reports he has been well overall since her last visit.  He recently took another job at [employer].  He did develop a small draining abscess on his right arm for which he is applying Neosporin.  He reports that started about 3 days ago and is covered with a Band-Aid.  He reports he is taking his Zytiga pills faithfully 1000 g p.o. daily with prednisone.  He notes he does have no major side effects other than some occasional hot flashes.  He reports he is eating well and would like to get his weight down into the 190s.  He is down 3 pounds from his last visit.  He reports that he is not having any chest pain, headache, or vision changes.  He has had no recent infectious symptoms such as runny nose, sore throat, or cough.  He otherwise denies any fevers, chills, sweats, nausea, vomiting or diarrhea.  A full 10 point ROS  is otherwise negative.  MEDICAL HISTORY:  Past Medical History:  Diagnosis Date   Blurred vision    Chronic kidney disease    renal cyst   Diverticulosis    Hemorrhoids    Hypertension    Morbid obesity (HCC)    Prostate cancer (HCC)    Rectal bleeding     SURGICAL HISTORY: Past Surgical History:  Procedure Laterality Date   CIRCUMCISION     needle core biopsy      SOCIAL HISTORY: Social History   Socioeconomic History   Marital status: Single    Spouse name: Not on file   Number of children: Not on file   Years of education: Not on file   Highest education level: Not on file  Occupational History   Not on file  Tobacco Use   Smoking status: Never   Smokeless tobacco: Never  Vaping Use   Vaping status: Never Used  Substance and Sexual Activity   Alcohol use: Yes    Alcohol/week: 2.0 standard drinks of alcohol    Types: 2 Cans of beer per week    Comment: daily use /2 24ouce cans   Drug use: No   Sexual activity: Yes  Other Topics Concern   Not on file  Social History Narrative   Not on file   Social Determinants of Health   Financial Resource Strain: Not on file  Food Insecurity: Not on file  Transportation Needs: Not on file  Physical Activity: Not on file  Stress: Not on file  Social Connections: Not on  file  Intimate Partner Violence: Not on file    FAMILY HISTORY: Family History  Problem Relation Age of Onset   Colon cancer Neg Hx    Esophageal cancer Neg Hx    Rectal cancer Neg Hx    Stomach cancer Neg Hx    Cancer Neg Hx     ALLERGIES:  has No Known Allergies.  MEDICATIONS:  Current Outpatient Medications  Medication Sig Dispense Refill   abiraterone acetate (ZYTIGA) 250 MG tablet Take 4 tablets (1,000 mg total) by mouth daily. Take on an empty stomach 1 hour before or 2 hours after a meal 120 tablet 3   ondansetron (ZOFRAN) 4 MG tablet Take 1 tablet (4 mg total) by mouth every 6 (six) hours. 12 tablet 0   predniSONE (DELTASONE) 5  MG tablet TAKE 1 TABLET DAILY WITH   BREAKFAST 90 tablet 1   sildenafil (VIAGRA) 50 MG tablet Take 1 tablet (50 mg) by mouth daily as needed for erectile dysfunction. 30 tablet 0   No current facility-administered medications for this visit.    REVIEW OF SYSTEMS:   Constitutional: ( - ) fevers, ( - )  chills , ( - ) night sweats Eyes: ( - ) blurriness of vision, ( - ) double vision, ( - ) watery eyes Ears, nose, mouth, throat, and face: ( - ) mucositis, ( - ) sore throat Respiratory: ( - ) cough, ( - ) dyspnea, ( - ) wheezes Cardiovascular: ( - ) palpitation, ( - ) chest discomfort, ( - ) lower extremity swelling Gastrointestinal:  ( - ) nausea, ( - ) heartburn, ( - ) change in bowel habits Skin: ( - ) abnormal skin rashes Lymphatics: ( - ) new lymphadenopathy, ( - ) easy bruising Neurological: ( - ) numbness, ( - ) tingling, ( - ) new weaknesses Behavioral/Psych: ( - ) mood change, ( - ) new changes  All other systems were reviewed with the patient and are negative.  PHYSICAL EXAMINATION:  There were no vitals filed for this visit.  There were no vitals filed for this visit.   GENERAL: Well-appearing middle-aged African-American male, alert, no distress and comfortable SKIN: skin color, texture, turgor are normal, no rashes or significant lesions EYES: conjunctiva are pink and non-injected, sclera clear LUNGS: clear to auscultation and percussion with normal breathing effort HEART: regular rate & rhythm and no murmurs and no lower extremity edema Musculoskeletal: no cyanosis of digits and no clubbing  PSYCH: alert & oriented x 3, fluent speech NEURO: no focal motor/sensory deficits  LABORATORY DATA:  I have reviewed the data as listed    Latest Ref Rng & Units 08/12/2022    8:57 AM 10/20/2021   12:58 PM 11/20/2020   10:29 AM  CBC  WBC 4.0 - 10.5 K/uL 8.0  8.1  5.0   Hemoglobin 13.0 - 17.0 g/dL 96.0  45.4  09.8   Hematocrit 39.0 - 52.0 % 36.7  35.9  36.4   Platelets 150 -  400 K/uL 325  241  282        Latest Ref Rng & Units 08/12/2022    8:57 AM 10/20/2021   12:58 PM 11/20/2020   10:29 AM  CMP  Glucose 70 - 99 mg/dL 119  147  96   BUN 8 - 23 mg/dL 19  15  19    Creatinine 0.61 - 1.24 mg/dL 8.29  5.62  1.30   Sodium 135 - 145 mmol/L 143  141  142  Potassium 3.5 - 5.1 mmol/L 4.3  3.8  4.0   Chloride 98 - 111 mmol/L 108  108  108   CO2 22 - 32 mmol/L 29  21  25    Calcium 8.9 - 10.3 mg/dL 9.7  8.8  9.1   Total Protein 6.5 - 8.1 g/dL 7.7  7.1  7.5   Total Bilirubin 0.3 - 1.2 mg/dL 0.4  0.6  0.8   Alkaline Phos 38 - 126 U/L 78  69  85   AST 15 - 41 U/L 18  23  30    ALT 0 - 44 U/L 17  26  23      RADIOGRAPHIC STUDIES: No results found.  ASSESSMENT & PLAN Charles [name] 62 y.o. male with medical history significant for castrate sensitive prostate cancer who presents for a follow up visit.  # Castrate Sensitive Prostate Cancer --last dose of Eligard on 08/12/2022 at 45 mg (6 month dose). Due for repeat injection in Dec 2024.  -- at every visit will obtain labs to include CMP, CBC, and PSA -- If the PSA is elevated from prior would need to pursue a new baseline set of imaging.  Consider PSMA scan versus CT chest abdomen pelvis -- continue Zytiga 1000 mg p.o. daily with prednisone 5 mg daily.   --Last PSA was 1.7 on 08/12/2022, prior to restarting Zytiga. --labs today show white blood cell 7.6, hemoglobin 12.0, MCV 93.6, and platelets of 377 -- Plan to have the patient return to clinic in 3 months time or sooner if there are concerning findings in the above lab work/imaging.  #ED -- Note Cialis 20 mg is not working for him, has requested Viagra 50 milligrams p.o. as needed.  Called in this medication to [facility] for him.  # Healthcare Maintenance -- Patient has requested a referral to her primary care.  Will make referral to [facility] PCP  No orders of the defined types were placed in this encounter.   All questions were answered. The  patient knows to call the clinic with any problems, questions or concerns.  A total of more than 30 minutes were spent on this encounter with face-to-face time and non-face-to-face time, including preparing to see the patient, ordering tests and/or medications, counseling the patient and coordination of care as outlined above.   Ulysees Barns, MD Department of Hematology/Oncology [facility] at [facility] Phone: [phone] Pager: [phone] Email: [email or web address]  11/29/2022 9:18 PM

## 2022-11-30 ENCOUNTER — Inpatient Hospital Stay: Payer: No Typology Code available for payment source | Attending: Oncology

## 2022-11-30 ENCOUNTER — Inpatient Hospital Stay (HOSPITAL_BASED_OUTPATIENT_CLINIC_OR_DEPARTMENT_OTHER): Payer: No Typology Code available for payment source | Admitting: Hematology and Oncology

## 2022-11-30 VITALS — BP 173/103 | HR 76 | Temp 97.9°F | Resp 15 | Wt 209.8 lb

## 2022-11-30 DIAGNOSIS — C61 Malignant neoplasm of prostate: Secondary | ICD-10-CM | POA: Diagnosis not present

## 2022-11-30 LAB — CBC WITH DIFFERENTIAL (CANCER CENTER ONLY)
Abs Immature Granulocytes: 0.02 10*3/uL (ref 0.00–0.07)
Basophils Absolute: 0 10*3/uL (ref 0.0–0.1)
Basophils Relative: 0 %
Eosinophils Absolute: 0.2 10*3/uL (ref 0.0–0.5)
Eosinophils Relative: 2 %
HCT: 35.2 % — ABNORMAL LOW (ref 39.0–52.0)
Hemoglobin: 12 g/dL — ABNORMAL LOW (ref 13.0–17.0)
Immature Granulocytes: 0 %
Lymphocytes Relative: 28 %
Lymphs Abs: 2.1 10*3/uL (ref 0.7–4.0)
MCH: 31.9 pg (ref 26.0–34.0)
MCHC: 34.1 g/dL (ref 30.0–36.0)
MCV: 93.6 fL (ref 80.0–100.0)
Monocytes Absolute: 0.5 10*3/uL (ref 0.1–1.0)
Monocytes Relative: 7 %
Neutro Abs: 4.8 10*3/uL (ref 1.7–7.7)
Neutrophils Relative %: 63 %
Platelet Count: 377 10*3/uL (ref 150–400)
RBC: 3.76 MIL/uL — ABNORMAL LOW (ref 4.22–5.81)
RDW: 12.6 % (ref 11.5–15.5)
WBC Count: 7.6 10*3/uL (ref 4.0–10.5)
nRBC: 0 % (ref 0.0–0.2)

## 2022-11-30 LAB — CMP (CANCER CENTER ONLY)
ALT: 13 U/L (ref 0–44)
AST: 15 U/L (ref 15–41)
Albumin: 4.2 g/dL (ref 3.5–5.0)
Alkaline Phosphatase: 77 U/L (ref 38–126)
Anion gap: 6 (ref 5–15)
BUN: 13 mg/dL (ref 8–23)
CO2: 29 mmol/L (ref 22–32)
Calcium: 9.4 mg/dL (ref 8.9–10.3)
Chloride: 105 mmol/L (ref 98–111)
Creatinine: 0.91 mg/dL (ref 0.61–1.24)
GFR, Estimated: >60 mL/min (ref >60)
Glucose, Bld: 103 mg/dL — ABNORMAL HIGH (ref 70–99)
Potassium: 4 mmol/L (ref 3.5–5.1)
Sodium: 140 mmol/L (ref 135–145)
Total Bilirubin: 0.4 mg/dL (ref 0.3–1.2)
Total Protein: 7 g/dL (ref 6.5–8.1)

## 2022-12-01 LAB — PROSTATE-SPECIFIC AG, SERUM (LABCORP): Prostate Specific Ag, Serum: <0.1 ng/mL (ref 0.0–4.0)

## 2022-12-01 LAB — TESTOSTERONE: Testosterone: <3 ng/dL — ABNORMAL LOW (ref 264–916)

## 2022-12-03 ENCOUNTER — Telehealth: Payer: Self-pay | Admitting: *Deleted

## 2022-12-03 NOTE — Telephone Encounter (Signed)
TCT patient regarding recent lab results. No answer but was able to leave vm message regarding good lab results. Advised to call back @ [phone] to review these results at his convenience  Will advise that his testosterone and PSA levels are both undetectable. These are excellent results. We will continue his Zytiga therapy and Lupron shots as scheduled.

## 2022-12-03 NOTE — Telephone Encounter (Signed)
-----   Message from Ulysees Barns IV sent at 12/02/2022  2:15 PM EDT ----- Please let Mr. Heaving to know that his testosterone and PSA levels are both undetectable.  These are excellent results.  We will continue his Zytiga therapy and Lupron shots as scheduled. ----- Message ----- From: Leory Plowman, Lab [facility] [place] Sent: 11/30/2022   8:04 AM EDT To: Jaci Standard, MD

## 2023-02-28 ENCOUNTER — Other Ambulatory Visit: Payer: Self-pay | Admitting: Hematology and Oncology

## 2023-02-28 DIAGNOSIS — C61 Malignant neoplasm of prostate: Secondary | ICD-10-CM

## 2023-02-28 NOTE — Progress Notes (Unsigned)
[facility] Telephone:([phone]   Fax:(336) [phone]  PROGRESS NOTE  Patient Care Team: Patient, No Pcp Per as PCP - General (General Practice) Karie Soda, MD as Consulting Physician (General Surgery) McKenzie, Mardene Celeste, MD as Consulting Physician (Urology) Pyrtle, Carie Caddy, MD as Consulting Physician (Gastroenterology) Margaretmary Dys, MD as Consulting Physician (Radiation Oncology) Jaci Standard, MD as Consulting Physician (Hematology and Oncology)  Hematological/Oncological History # Castrate Sensitive Prostate Cancer 08/28/2016: left transgluteal pelvic mass biopsy confirmed diagnosis 10/2016: Started Zytiga therapy 1000 mg PO daily with prednisone 5 mg 11/18/2021: last visit with Dr. Clelia Croft. Had been off Zytiga x 2 years. Restarted at that time.   Interval History:  Charles [name] 62 y.o. male with medical history significant for castrate sensitive prostate cancer who presents for a follow up visit. The patient's last visit was on 11/30/2022. In the interim since the last visit he has had no major changes in his health.  On exam today Charles [name] reports *** He otherwise denies any fevers, chills, sweats, nausea, vomiting or diarrhea.  A full 10 point ROS is otherwise negative.  MEDICAL HISTORY:  Past Medical History:  Diagnosis Date   Blurred vision    Chronic kidney disease    renal cyst   Diverticulosis    Hemorrhoids    Hypertension    Morbid obesity (HCC)    Prostate cancer (HCC)    Rectal bleeding     SURGICAL HISTORY: Past Surgical History:  Procedure Laterality Date   CIRCUMCISION     needle core biopsy      SOCIAL HISTORY: Social History   Socioeconomic History   Marital status: Single    Spouse name: Not on file   Number of children: Not on file   Years of education: Not on file   Highest education level: Not on file  Occupational History   Not on file  Tobacco Use   Smoking status: Never   Smokeless tobacco:  Never  Vaping Use   Vaping status: Never Used  Substance and Sexual Activity   Alcohol use: Yes    Alcohol/week: 2.0 standard drinks of alcohol    Types: 2 Cans of beer per week    Comment: daily use /2 24ouce cans   Drug use: No   Sexual activity: Yes  Other Topics Concern   Not on file  Social History Narrative   Not on file   Social Drivers of Health   Financial Resource Strain: Not on file  Food Insecurity: Not on file  Transportation Needs: Not on file  Physical Activity: Not on file  Stress: Not on file  Social Connections: Not on file  Intimate Partner Violence: Not on file    FAMILY HISTORY: Family History  Problem Relation Age of Onset   Colon cancer Neg Hx    Esophageal cancer Neg Hx    Rectal cancer Neg Hx    Stomach cancer Neg Hx    Cancer Neg Hx     ALLERGIES:  has no known allergies.  MEDICATIONS:  Current Outpatient Medications  Medication Sig Dispense Refill   abiraterone acetate (ZYTIGA) 250 MG tablet Take 4 tablets (1,000 mg total) by mouth daily. Take on an empty stomach 1 hour before or 2 hours after a meal 120 tablet 3   ondansetron (ZOFRAN) 4 MG tablet Take 1 tablet (4 mg total) by mouth every 6 (six) hours. 12 tablet 0   predniSONE (DELTASONE) 5 MG tablet TAKE 1 TABLET DAILY  WITH   BREAKFAST 90 tablet 1   sildenafil (VIAGRA) 50 MG tablet Take 1 tablet (50 mg) by mouth daily as needed for erectile dysfunction. 30 tablet 0   No current facility-administered medications for this visit.    REVIEW OF SYSTEMS:   Constitutional: ( - ) fevers, ( - )  chills , ( - ) night sweats Eyes: ( - ) blurriness of vision, ( - ) double vision, ( - ) watery eyes Ears, nose, mouth, throat, and face: ( - ) mucositis, ( - ) sore throat Respiratory: ( - ) cough, ( - ) dyspnea, ( - ) wheezes Cardiovascular: ( - ) palpitation, ( - ) chest discomfort, ( - ) lower extremity swelling Gastrointestinal:  ( - ) nausea, ( - ) heartburn, ( - ) change in bowel habits Skin:  ( - ) abnormal skin rashes Lymphatics: ( - ) new lymphadenopathy, ( - ) easy bruising Neurological: ( - ) numbness, ( - ) tingling, ( - ) new weaknesses Behavioral/Psych: ( - ) mood change, ( - ) new changes  All other systems were reviewed with the patient and are negative.  PHYSICAL EXAMINATION:  There were no vitals filed for this visit.  There were no vitals filed for this visit.   GENERAL: Well-appearing middle-aged African-American male, alert, no distress and comfortable SKIN: skin color, texture, turgor are normal, no rashes or significant lesions EYES: conjunctiva are pink and non-injected, sclera clear LUNGS: clear to auscultation and percussion with normal breathing effort HEART: regular rate & rhythm and no murmurs and no lower extremity edema Musculoskeletal: no cyanosis of digits and no clubbing  PSYCH: alert & oriented x 3, fluent speech NEURO: no focal motor/sensory deficits  LABORATORY DATA:  I have reviewed the data as listed    Latest Ref Rng & Units 11/30/2022    7:32 AM 08/12/2022    8:57 AM 10/20/2021   12:58 PM  CBC  WBC 4.0 - 10.5 K/uL 7.6  8.0  8.1   Hemoglobin 13.0 - 17.0 g/dL 04.5  40.9  81.1   Hematocrit 39.0 - 52.0 % 35.2  36.7  35.9   Platelets 150 - 400 K/uL 377  325  241        Latest Ref Rng & Units 11/30/2022    7:32 AM 08/12/2022    8:57 AM 10/20/2021   12:58 PM  CMP  Glucose 70 - 99 mg/dL 914  782  956   BUN 8 - 23 mg/dL 13  19  15    Creatinine 0.61 - 1.24 mg/dL 2.13  0.86  5.78   Sodium 135 - 145 mmol/L 140  143  141   Potassium 3.5 - 5.1 mmol/L 4.0  4.3  3.8   Chloride 98 - 111 mmol/L 105  108  108   CO2 22 - 32 mmol/L 29  29  21    Calcium 8.9 - 10.3 mg/dL 9.4  9.7  8.8   Total Protein 6.5 - 8.1 g/dL 7.0  7.7  7.1   Total Bilirubin 0.3 - 1.2 mg/dL 0.4  0.4  0.6   Alkaline Phos 38 - 126 U/L 77  78  69   AST 15 - 41 U/L 15  18  23    ALT 0 - 44 U/L 13  17  26      RADIOGRAPHIC STUDIES: No results found.  ASSESSMENT & PLAN Charles [name] 62 y.o. male with medical history significant for castrate sensitive prostate cancer who presents  for a follow up visit.  # Castrate Sensitive Prostate Cancer --last dose of Eligard on 08/12/2022 at 45 mg (6 month dose). Due for repeat injection in Dec 2024.  -- at every visit will obtain labs to include CMP, CBC, and PSA -- If the PSA is elevated from prior would need to pursue a new baseline set of imaging.  Consider PSMA scan versus CT chest abdomen pelvis -- continue Zytiga 1000 mg p.o. daily with prednisone 5 mg daily.   --Last PSA was 1.7 on 08/12/2022, prior to restarting Zytiga. --labs today show white blood cell *** -- Plan to have the patient return to clinic in 3 months time or sooner if there are concerning findings in the above lab work/imaging.  #ED -- Note Cialis 20 mg is not working for him, has requested Viagra 50 milligrams p.o. as needed.  Called in this medication to [facility] for him.  # Healthcare Maintenance -- Patient has requested a referral to her primary care.  Will make referral to [place] PCP  No orders of the defined types were placed in this encounter.   All questions were answered. The patient knows to call the clinic with any problems, questions or concerns.  A total of more than 30 minutes were spent on this encounter with face-to-face time and non-face-to-face time, including preparing to see the patient, ordering tests and/or medications, counseling the patient and coordination of care as outlined above.   Ulysees Barns, MD Department of Hematology/Oncology [facility] Phone: [phone] Pager: [phone] Email: [email or web address]  02/28/2023 6:36 PM

## 2023-03-01 ENCOUNTER — Inpatient Hospital Stay: Payer: No Typology Code available for payment source | Attending: Hematology and Oncology

## 2023-03-01 ENCOUNTER — Inpatient Hospital Stay: Payer: No Typology Code available for payment source | Admitting: Hematology and Oncology

## 2023-03-01 DIAGNOSIS — C61 Malignant neoplasm of prostate: Secondary | ICD-10-CM

## 2023-03-03 ENCOUNTER — Encounter: Payer: Self-pay | Admitting: Oncology

## 2023-03-04 ENCOUNTER — Telehealth: Payer: Self-pay | Admitting: Physician Assistant

## 2023-03-04 NOTE — Telephone Encounter (Signed)
 Charles [name]

## 2023-03-16 ENCOUNTER — Encounter: Payer: Self-pay | Admitting: Oncology

## 2023-03-19 ENCOUNTER — Telehealth: Payer: Self-pay | Admitting: Hematology and Oncology

## 2023-03-23 ENCOUNTER — Inpatient Hospital Stay: Payer: Self-pay

## 2023-03-23 ENCOUNTER — Encounter: Payer: Self-pay | Admitting: Oncology

## 2023-03-23 ENCOUNTER — Inpatient Hospital Stay (HOSPITAL_BASED_OUTPATIENT_CLINIC_OR_DEPARTMENT_OTHER): Payer: Self-pay | Admitting: Hematology and Oncology

## 2023-03-23 ENCOUNTER — Inpatient Hospital Stay: Payer: Self-pay | Attending: Hematology and Oncology

## 2023-03-23 ENCOUNTER — Ambulatory Visit: Payer: No Typology Code available for payment source | Admitting: Physician Assistant

## 2023-03-23 ENCOUNTER — Encounter: Payer: Self-pay | Admitting: *Deleted

## 2023-03-23 ENCOUNTER — Other Ambulatory Visit: Payer: No Typology Code available for payment source

## 2023-03-23 VITALS — BP 130/94 | HR 115 | Temp 98.1°F | Resp 17 | Wt 210.9 lb

## 2023-03-23 DIAGNOSIS — Z7952 Long term (current) use of systemic steroids: Secondary | ICD-10-CM | POA: Insufficient documentation

## 2023-03-23 DIAGNOSIS — N529 Male erectile dysfunction, unspecified: Secondary | ICD-10-CM | POA: Insufficient documentation

## 2023-03-23 DIAGNOSIS — C61 Malignant neoplasm of prostate: Secondary | ICD-10-CM | POA: Diagnosis present

## 2023-03-23 DIAGNOSIS — Z0000 Encounter for general adult medical examination without abnormal findings: Secondary | ICD-10-CM

## 2023-03-23 DIAGNOSIS — Z79899 Other long term (current) drug therapy: Secondary | ICD-10-CM | POA: Diagnosis not present

## 2023-03-23 DIAGNOSIS — I10 Essential (primary) hypertension: Secondary | ICD-10-CM | POA: Insufficient documentation

## 2023-03-23 LAB — CMP (CANCER CENTER ONLY)
ALT: 25 U/L (ref 0–44)
AST: 37 U/L (ref 15–41)
Albumin: 4.6 g/dL (ref 3.5–5.0)
Alkaline Phosphatase: 101 U/L (ref 38–126)
Anion gap: 14 (ref 5–15)
BUN: 22 mg/dL (ref 8–23)
CO2: 24 mmol/L (ref 22–32)
Calcium: 9.5 mg/dL (ref 8.9–10.3)
Chloride: 106 mmol/L (ref 98–111)
Creatinine: 1 mg/dL (ref 0.61–1.24)
GFR, Estimated: >60 mL/min (ref >60)
Glucose, Bld: 80 mg/dL (ref 70–99)
Potassium: 3.8 mmol/L (ref 3.5–5.1)
Sodium: 144 mmol/L (ref 135–145)
Total Bilirubin: 0.4 mg/dL (ref 0.0–1.2)
Total Protein: 7.9 g/dL (ref 6.5–8.1)

## 2023-03-23 LAB — CBC WITH DIFFERENTIAL (CANCER CENTER ONLY)
Abs Immature Granulocytes: 0.01 10*3/uL (ref 0.00–0.07)
Basophils Absolute: 0.1 10*3/uL (ref 0.0–0.1)
Basophils Relative: 1 %
Eosinophils Absolute: 0.1 10*3/uL (ref 0.0–0.5)
Eosinophils Relative: 1 %
HCT: 37.9 % — ABNORMAL LOW (ref 39.0–52.0)
Hemoglobin: 13 g/dL (ref 13.0–17.0)
Immature Granulocytes: 0 %
Lymphocytes Relative: 25 %
Lymphs Abs: 2.2 10*3/uL (ref 0.7–4.0)
MCH: 32.2 pg (ref 26.0–34.0)
MCHC: 34.3 g/dL (ref 30.0–36.0)
MCV: 93.8 fL (ref 80.0–100.0)
Monocytes Absolute: 0.5 10*3/uL (ref 0.1–1.0)
Monocytes Relative: 5 %
Neutro Abs: 5.9 10*3/uL (ref 1.7–7.7)
Neutrophils Relative %: 68 %
Platelet Count: 401 10*3/uL — ABNORMAL HIGH (ref 150–400)
RBC: 4.04 MIL/uL — ABNORMAL LOW (ref 4.22–5.81)
RDW: 13.1 % (ref 11.5–15.5)
WBC Count: 8.7 10*3/uL (ref 4.0–10.5)
nRBC: 0 % (ref 0.0–0.2)

## 2023-03-23 MED ORDER — LEUPROLIDE ACETATE (6 MONTH) 45 MG ~~LOC~~ KIT: 45.0000 mg | PACK | Freq: Once | SUBCUTANEOUS | Status: DC

## 2023-03-23 MED ORDER — SILDENAFIL CITRATE 50 MG PO TABS: 50.0000 mg | ORAL_TABLET | Freq: Every day | ORAL | 0 refills | Status: AC | PRN

## 2023-03-23 MED ORDER — AMLODIPINE BESYLATE 5 MG PO TABS: 5.0000 mg | ORAL_TABLET | Freq: Every day | ORAL | 1 refills | Status: AC

## 2023-03-23 NOTE — Progress Notes (Signed)
Pt scheduled to receive injection eligard 03/23/2023. No order placed to administered vaccine. Team notified, new order placed. After verification order was d/c'd for today due to a recent change in [employer] Pt. Notified of wait and requested to reschedule his appointment and see the team's nurse to follow up on a previous request to seek an outside PCP. Team aware and will reach out to Pt. Via telephone for update and new appointment. Encounter closed.

## 2023-03-23 NOTE — Progress Notes (Signed)
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 Clelia Croft. Had been off Zytiga x 2 years. Restarted at that time.   Interval History:  Charles [name] 63 y.o. male with medical history significant for castrate sensitive prostate cancer who presents for a follow up visit. The patient's last visit was on 11/30/2022. In the interim since the last visit he has had no major changes in his health.  On exam today Mr. [name] reports he is doing his best to try to lose weight with daily exercise and joining a recreation center as well as [employer].  He reports he is trying to do some boxing and other activities.  He reports that he is taking Zytiga faithfully every morning with prednisone.  It is not causing him any major symptoms.  He notes that he tried the Viagra 50 mg p.o. but was ineffective.  He would like a stronger dosage.  He also notes that he would like to establish with a primary care provider.  (A new referral was put in today).  Otherwise he feels quite well and has no questions concerns or complaints.  He otherwise denies any fevers, chills, sweats, nausea, vomiting or diarrhea.  A full 10 point ROS is otherwise negative.  MEDICAL HISTORY:  Past Medical History:  Diagnosis Date   Blurred vision    Chronic kidney  disease    renal cyst   Diverticulosis    Hemorrhoids    Hypertension    Morbid obesity (HCC)    Prostate cancer (HCC)    Rectal bleeding     SURGICAL HISTORY: Past Surgical History:  Procedure Laterality Date   CIRCUMCISION     needle core biopsy      SOCIAL HISTORY: Social History   Socioeconomic History   Marital status: Single    Spouse name: Not on file   Number of children: Not on file   Years of education: Not on file   Highest education level: Not on file  Occupational History   Not on file  Tobacco Use   Smoking status: Never   Smokeless tobacco: Never  Vaping Use   Vaping status: Never Used  Substance and Sexual Activity   Alcohol use: Yes    Alcohol/week: 2.0 standard drinks of alcohol    Types: 2 Cans of beer per week    Comment: daily use /2 24ouce cans   Drug use: No   Sexual activity: Yes  Other Topics Concern   Not on file  Social History Narrative   Not on file   Social Drivers of Health   Financial Resource Strain: Not on file  Food Insecurity: Not on file  Transportation Needs: Not on file  Physical Activity: Not on file  Stress: Not on file  Social Connections: Not on file  Intimate Partner Violence: Not on file    FAMILY HISTORY: Family History  Problem Relation Age of Onset   Colon cancer Neg Hx  Esophageal cancer Neg Hx    Rectal cancer Neg Hx    Stomach cancer Neg Hx    Cancer Neg Hx     ALLERGIES:  has no known allergies.  MEDICATIONS:  Current Outpatient Medications  Medication Sig Dispense Refill   amLODipine (NORVASC) 5 MG tablet Take 1 tablet (5 mg total) by mouth daily. 90 tablet 1   abiraterone acetate (ZYTIGA) 250 MG tablet Take 4 tablets (1,000 mg total) by mouth daily. Take on an empty stomach 1 hour before or 2 hours after a meal 120 tablet 3   ondansetron (ZOFRAN) 4 MG tablet Take 1 tablet (4 mg total) by mouth every 6 (six) hours. 12 tablet 0   predniSONE (DELTASONE) 5 MG tablet TAKE 1 TABLET DAILY WITH    BREAKFAST 90 tablet 1   sildenafil (VIAGRA) 50 MG tablet Take 1 tablet (50 mg total) by mouth daily as needed for erectile dysfunction. 60 tablet 0   No current facility-administered medications for this visit.    REVIEW OF SYSTEMS:   Constitutional: ( - ) fevers, ( - )  chills , ( - ) night sweats Eyes: ( - ) blurriness of vision, ( - ) double vision, ( - ) watery eyes Ears, nose, mouth, throat, and face: ( - ) mucositis, ( - ) sore throat Respiratory: ( - ) cough, ( - ) dyspnea, ( - ) wheezes Cardiovascular: ( - ) palpitation, ( - ) chest discomfort, ( - ) lower extremity swelling Gastrointestinal:  ( - ) nausea, ( - ) heartburn, ( - ) change in bowel habits Skin: ( - ) abnormal skin rashes Lymphatics: ( - ) new lymphadenopathy, ( - ) easy bruising Neurological: ( - ) numbness, ( - ) tingling, ( - ) new weaknesses Behavioral/Psych: ( - ) mood change, ( - ) new changes  All other systems were reviewed with the patient and are negative.  PHYSICAL EXAMINATION:  Vitals:   03/23/23 0912  BP: (!) 130/94  Pulse: (!) 115  Resp: 17  Temp: 98.1 F (36.7 C)  SpO2: 98%    Filed Weights   03/23/23 0912  Weight: 210 lb 14.4 oz (95.7 kg)     GENERAL: Well-appearing middle-aged African-American male, alert, no distress and comfortable SKIN: skin color, texture, turgor are normal, no rashes or significant lesions EYES: conjunctiva are pink and non-injected, sclera clear LUNGS: clear to auscultation and percussion with normal breathing effort HEART: regular rate & rhythm and no murmurs and no lower extremity edema Musculoskeletal: no cyanosis of digits and no clubbing  PSYCH: alert & oriented x 3, fluent speech NEURO: no focal motor/sensory deficits  LABORATORY DATA:  I have reviewed the data as listed    Latest Ref Rng & Units 03/23/2023    8:21 AM 11/30/2022    7:32 AM 08/12/2022    8:57 AM  CBC  WBC 4.0 - 10.5 K/uL 8.7  7.6  8.0   Hemoglobin 13.0 - 17.0 g/dL 16.1  09.6  04.5    Hematocrit 39.0 - 52.0 % 37.9  35.2  36.7   Platelets 150 - 400 K/uL 401  377  325        Latest Ref Rng & Units 03/23/2023    8:21 AM 11/30/2022    7:32 AM 08/12/2022    8:57 AM  CMP  Glucose 70 - 99 mg/dL 80  409  811   BUN 8 - 23 mg/dL 22  13  19    Creatinine 0.61 -  1.24 mg/dL 6.38  7.56  4.33   Sodium 135 - 145 mmol/L 144  140  143   Potassium 3.5 - 5.1 mmol/L 3.8  4.0  4.3   Chloride 98 - 111 mmol/L 106  105  108   CO2 22 - 32 mmol/L 24  29  29    Calcium 8.9 - 10.3 mg/dL 9.5  9.4  9.7   Total Protein 6.5 - 8.1 g/dL 7.9  7.0  7.7   Total Bilirubin 0.0 - 1.2 mg/dL 0.4  0.4  0.4   Alkaline Phos 38 - 126 U/L 101  77  78   AST 15 - 41 U/L 37  15  18   ALT 0 - 44 U/L 25  13  17      RADIOGRAPHIC STUDIES: No results found.  ASSESSMENT & PLAN Charles [name] 63 y.o. male with medical history significant for castrate sensitive prostate cancer who presents for a follow up visit.  # Castrate Sensitive Prostate Cancer --last dose of Eligard on 08/12/2022 at 45 mg (6 month dose). Eligard shot due today (every 6 month) but patient insurance is changed and we are not able to get this approved.  Will have him return to clinic to have the shot. -- at every visit will obtain labs to include CMP, CBC, and PSA -- If the PSA is elevated from prior would need to pursue a new baseline set of imaging.  Consider PSMA scan versus CT chest abdomen pelvis -- continue Zytiga 1000 mg p.o. daily with prednisone 5 mg daily.   --Last PSA was <0.1 at last check on 11/30/2022.  --labs today show white blood cell 8.7, hemoglobin 13.0, MCV 93.8, platelets 4 1 -- Plan to have the patient return to clinic in 3 months time or sooner if there are concerning findings in the above lab work/imaging.  #ED -- Patient notes Viagra 50 mg p.o. was ineffective.  Recommend he increase to 100 mg p.o. as needed.  Precautions for lightheadedness and operating heavy machinery  # Healthcare Maintenance -- Patient has  requested a referral to her primary care.  Will make referral to [place] PCP -- Due to elevated blood pressure will prescribe amlodipine 5 mg p.o. daily.  Orders Placed This Encounter  Procedures   Ambulatory referral to Internal Medicine    Referral Priority:   Routine    Referral Type:   Consultation    Referral Reason:   Specialty Services Required    Requested Specialty:   Internal Medicine    Number of Visits Requested:   1    All questions were answered. The patient knows to call the clinic with any problems, questions or concerns.  A total of more than 30 minutes were spent on this encounter with face-to-face time and non-face-to-face time, including preparing to see the patient, ordering tests and/or medications, counseling the patient and coordination of care as outlined above.   Ulysees Barns, MD Department of Hematology/Oncology [facility] Phone: [phone] Pager: [phone] Email: [email or web address]  03/23/2023 10:52 AM

## 2023-03-24 LAB — TESTOSTERONE: Testosterone: <3 ng/dL — ABNORMAL LOW (ref 264–916)

## 2023-03-24 LAB — PROSTATE-SPECIFIC AG, SERUM (LABCORP): Prostate Specific Ag, Serum: 0.1 ng/mL (ref 0.0–4.0)

## 2023-03-26 ENCOUNTER — Telehealth: Payer: Self-pay | Admitting: Hematology and Oncology

## 2023-03-30 ENCOUNTER — Inpatient Hospital Stay: Payer: Self-pay

## 2023-05-11 ENCOUNTER — Ambulatory Visit: Payer: Self-pay | Admitting: Family Medicine

## 2023-05-24 ENCOUNTER — Telehealth: Payer: Self-pay | Admitting: Pharmacy Technician

## 2023-05-24 ENCOUNTER — Encounter: Payer: Self-pay | Admitting: Hematology and Oncology

## 2023-05-24 ENCOUNTER — Other Ambulatory Visit: Payer: Self-pay | Admitting: *Deleted

## 2023-05-24 ENCOUNTER — Other Ambulatory Visit (HOSPITAL_COMMUNITY): Payer: Self-pay

## 2023-05-24 DIAGNOSIS — C61 Malignant neoplasm of prostate: Secondary | ICD-10-CM

## 2023-05-24 MED ORDER — ABIRATERONE ACETATE 250 MG PO TABS: 1000.0000 mg | ORAL_TABLET | Freq: Every day | ORAL | 3 refills | Status: DC

## 2023-05-24 NOTE — Telephone Encounter (Signed)
 Oral Oncology Patient Advocate Encounter   Received notification that prior authorization for abiraterone is required.   PA submitted on 05/24/23 Key [id number] Status is pending   Sherilyn Dacosta, CPhT Specialty Pharmacy Patient Advocate Phone: [phone] Fax: [phone]

## 2023-05-25 ENCOUNTER — Other Ambulatory Visit (HOSPITAL_COMMUNITY): Payer: Self-pay

## 2023-05-25 NOTE — Telephone Encounter (Signed)
 Oral Oncology Patient Advocate Encounter  Prior Authorization for abiraterone has been approved.    PA# [id number]  Effective dates: 05/10/23 through 05/09/24  Patients must fill through [facility].   Sherilyn Dacosta, CPhT Specialty Pharmacy Patient Advocate Phone: [phone] Fax: [phone]

## 2023-05-31 ENCOUNTER — Ambulatory Visit: Payer: Self-pay | Admitting: Nurse Practitioner

## 2023-05-31 NOTE — Progress Notes (Deleted)
 There were no vitals taken for this visit.   Subjective:    Patient ID: Charles [name], male    DOB: 04-29-1960, 63 y.o.   MRN: [id number]  HPI: Charles [name] is a 63 y.o. male  No chief complaint on file.  Patient presents to clinic to establish care with new PCP.  Introduced to Publishing rights manager role and practice setting.  All questions answered.  Discussed provider/patient relationship and expectations.  Patient reports a history of ***. Patient denies a history of: Hypertension, Elevated Cholesterol, Diabetes, Thyroid problems, Depression, Anxiety, Neurological problems, and Abdominal problems.   Active Ambulatory Problems    Diagnosis Date Noted   Malignant neoplasm of prostate (HCC) 09/17/2016   Prostate cancer (HCC)    Morbid obesity (HCC)    Hypertension    Chronic kidney disease    Resolved Ambulatory Problems    Diagnosis Date Noted   No Resolved Ambulatory Problems   Past Medical History:  Diagnosis Date   Blurred vision    Diverticulosis    Hemorrhoids    Rectal bleeding    Past Surgical History:  Procedure Laterality Date   CIRCUMCISION     needle core biopsy     Family History  Problem Relation Age of Onset   Colon cancer Neg Hx    Esophageal cancer Neg Hx    Rectal cancer Neg Hx    Stomach cancer Neg Hx    Cancer Neg Hx      Review of Systems  Per HPI unless specifically indicated above     Objective:    There were no vitals taken for this visit.  Wt Readings from Last 3 Encounters:  03/23/23 210 lb 14.4 oz (95.7 kg)  11/30/22 209 lb 12.8 oz (95.2 kg)  08/12/22 212 lb 3.2 oz (96.3 kg)    Physical Exam  Results for orders placed or performed in visit on 03/23/23  Prostate-Specific AG, Serum   Collection Time: 03/23/23  8:21 AM  Result Value Ref Range   Prostate Specific Ag, Serum 0.1 0.0 - 4.0 ng/mL  Testosterone   Collection Time: 03/23/23  8:21 AM  Result Value Ref Range   Testosterone <3 (L) 264 - 916 ng/dL  CMP  (Cancer Center only)   Collection Time: 03/23/23  8:21 AM  Result Value Ref Range   Sodium 144 135 - 145 mmol/L   Potassium 3.8 3.5 - 5.1 mmol/L   Chloride 106 98 - 111 mmol/L   CO2 24 22 - 32 mmol/L   Glucose, Bld 80 70 - 99 mg/dL   BUN 22 8 - 23 mg/dL   Creatinine 9.14 7.82 - 1.24 mg/dL   Calcium 9.5 8.9 - 95.6 mg/dL   Total Protein 7.9 6.5 - 8.1 g/dL   Albumin 4.6 3.5 - 5.0 g/dL   AST 37 15 - 41 U/L   ALT 25 0 - 44 U/L   Alkaline Phosphatase 101 38 - 126 U/L   Total Bilirubin 0.4 0.0 - 1.2 mg/dL   GFR, Estimated >21 >30 mL/min   Anion gap 14 5 - 15  CBC with Differential (Cancer Center Only)   Collection Time: 03/23/23  8:21 AM  Result Value Ref Range   WBC Count 8.7 4.0 - 10.5 K/uL   RBC 4.04 (L) 4.22 - 5.81 MIL/uL   Hemoglobin 13.0 13.0 - 17.0 g/dL   HCT 86.5 (L) 78.4 - 69.6 %   MCV 93.8 80.0 - 100.0 fL   MCH 32.2 26.0 -  34.0 pg   MCHC 34.3 30.0 - 36.0 g/dL   RDW 86.5 78.4 - 69.6 %   Platelet Count 401 (H) 150 - 400 K/uL   nRBC 0.0 0.0 - 0.2 %   Neutrophils Relative % 68 %   Neutro Abs 5.9 1.7 - 7.7 K/uL   Lymphocytes Relative 25 %   Lymphs Abs 2.2 0.7 - 4.0 K/uL   Monocytes Relative 5 %   Monocytes Absolute 0.5 0.1 - 1.0 K/uL   Eosinophils Relative 1 %   Eosinophils Absolute 0.1 0.0 - 0.5 K/uL   Basophils Relative 1 %   Basophils Absolute 0.1 0.0 - 0.1 K/uL   Immature Granulocytes 0 %   Abs Immature Granulocytes 0.01 0.00 - 0.07 K/uL      Assessment & Plan:   Problem List Items Addressed This Visit   None    Follow up plan: No follow-ups on file.

## 2023-06-16 ENCOUNTER — Other Ambulatory Visit: Payer: Self-pay | Admitting: *Deleted

## 2023-06-16 DIAGNOSIS — C61 Malignant neoplasm of prostate: Secondary | ICD-10-CM

## 2023-06-16 MED ORDER — PREDNISONE 5 MG PO TABS: 5.0000 mg | ORAL_TABLET | Freq: Every day | ORAL | 1 refills | Status: AC

## 2023-06-16 MED ORDER — ABIRATERONE ACETATE 250 MG PO TABS: 1000.0000 mg | ORAL_TABLET | Freq: Every day | ORAL | 3 refills | Status: AC

## 2023-06-28 ENCOUNTER — Inpatient Hospital Stay: Payer: Self-pay | Admitting: Hematology and Oncology

## 2023-06-28 ENCOUNTER — Inpatient Hospital Stay: Payer: Self-pay

## 2023-06-28 ENCOUNTER — Telehealth: Payer: Self-pay | Admitting: Hematology and Oncology

## 2023-06-28 ENCOUNTER — Telehealth: Payer: Self-pay | Admitting: *Deleted

## 2023-06-28 NOTE — Telephone Encounter (Signed)
 TCT patient as he did not show for his appt today. No answer but was able to leave vm message to call back to reschedule his appts.Charles [name]

## 2023-06-28 NOTE — Progress Notes (Unsigned)
 Rescheduled

## 2023-06-29 ENCOUNTER — Encounter: Payer: Self-pay | Admitting: Hematology and Oncology

## 2023-06-29 NOTE — Progress Notes (Deleted)
 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 Dirk Fredericks. Had been off Zytiga  x 2 years. Restarted at that time.   Interval History:  Charles [name] 63 y.o. male with medical history significant for castrate sensitive prostate cancer who presents for a follow up visit. The patient's last visit was on 03/23/2023. In the interim since the last visit he has had no major changes in his health.  On exam today Charles [name] reports ***  MEDICAL HISTORY:  Past Medical History:  Diagnosis Date   Blurred vision    Chronic kidney disease    renal cyst   Diverticulosis    Hemorrhoids    Hypertension    Morbid obesity (HCC)    Prostate cancer (HCC)    Rectal bleeding     SURGICAL HISTORY: Past Surgical History:  Procedure Laterality Date   CIRCUMCISION     needle core biopsy      SOCIAL HISTORY: Social History   Socioeconomic History   Marital status: Single    Spouse name: Not on file   Number of children: Not on file   Years of education: Not on file   Highest education level: Not on file  Occupational History   Not on file  Tobacco Use   Smoking status: Never   Smokeless tobacco: Never  Vaping Use   Vaping status: Never Used  Substance and Sexual Activity   Alcohol use: Yes    Alcohol/week: 2.0  standard drinks of alcohol    Types: 2 Cans of beer per week    Comment: daily use /2 24ouce cans   Drug use: No   Sexual activity: Yes  Other Topics Concern   Not on file  Social History Narrative   Not on file   Social Drivers of Health   Financial Resource Strain: Not on file  Food Insecurity: Not on file  Transportation Needs: Not on file  Physical Activity: Not on file  Stress: Not on file  Social Connections: Not on file  Intimate Partner Violence: Not on file    FAMILY HISTORY: Family History  Problem Relation Age of Onset   Colon cancer Neg Hx    Esophageal cancer Neg Hx    Rectal cancer Neg Hx    Stomach cancer Neg Hx    Cancer Neg Hx     ALLERGIES:  has no known allergies.  MEDICATIONS:  Current Outpatient Medications  Medication Sig Dispense Refill   abiraterone  acetate (ZYTIGA ) 250 MG tablet Take 4 tablets (1,000 mg total) by mouth daily. Take on an empty stomach 1 hour before or 2 hours after a meal 120 tablet 3   amLODipine  (NORVASC ) 5 MG tablet Take 1 tablet (5 mg total) by mouth daily. 90 tablet 1   ondansetron  (ZOFRAN ) 4 MG tablet Take 1 tablet (4 mg total) by mouth every 6 (six) hours. 12 tablet 0   predniSONE  (DELTASONE ) 5 MG tablet Take 1 tablet (5 mg  total) by mouth daily with breakfast. 90 tablet 1   sildenafil  (VIAGRA ) 50 MG tablet Take 1 tablet (50 mg total) by mouth daily as needed for erectile dysfunction. 60 tablet 0   No current facility-administered medications for this visit.    REVIEW OF SYSTEMS:   Constitutional: ( - ) fevers, ( - )  chills , ( - ) night sweats Eyes: ( - ) blurriness of vision, ( - ) double vision, ( - ) watery eyes Ears, nose, mouth, throat, and face: ( - ) mucositis, ( - ) sore throat Respiratory: ( - ) cough, ( - ) dyspnea, ( - ) wheezes Cardiovascular: ( - ) palpitation, ( - ) chest discomfort, ( - ) lower extremity swelling Gastrointestinal:  ( - ) nausea, ( - ) heartburn, ( - ) change in bowel habits Skin: ( -  ) abnormal skin rashes Lymphatics: ( - ) new lymphadenopathy, ( - ) easy bruising Neurological: ( - ) numbness, ( - ) tingling, ( - ) new weaknesses Behavioral/Psych: ( - ) mood change, ( - ) new changes  All other systems were reviewed with the patient and are negative.  PHYSICAL EXAMINATION:  There were no vitals filed for this visit.   There were no vitals filed for this visit.    GENERAL: Well-appearing middle-aged African-American male, alert, no distress and comfortable SKIN: skin color, texture, turgor are normal, no rashes or significant lesions EYES: conjunctiva are pink and non-injected, sclera clear LUNGS: clear to auscultation and percussion with normal breathing effort HEART: regular rate & rhythm and no murmurs and no lower extremity edema Musculoskeletal: no cyanosis of digits and no clubbing  PSYCH: alert & oriented x 3, fluent speech NEURO: no focal motor/sensory deficits  LABORATORY DATA:  I have reviewed the data as listed    Latest Ref Rng & Units 03/23/2023    8:21 AM 11/30/2022    7:32 AM 08/12/2022    8:57 AM  CBC  WBC 4.0 - 10.5 K/uL 8.7  7.6  8.0   Hemoglobin 13.0 - 17.0 g/dL 16.1  09.6  04.5   Hematocrit 39.0 - 52.0 % 37.9  35.2  36.7   Platelets 150 - 400 K/uL 401  377  325        Latest Ref Rng & Units 03/23/2023    8:21 AM 11/30/2022    7:32 AM 08/12/2022    8:57 AM  CMP  Glucose 70 - 99 mg/dL 80  409  811   BUN 8 - 23 mg/dL 22  13  19    Creatinine 0.61 - 1.24 mg/dL 9.14  7.82  9.56   Sodium 135 - 145 mmol/L 144  140  143   Potassium 3.5 - 5.1 mmol/L 3.8  4.0  4.3   Chloride 98 - 111 mmol/L 106  105  108   CO2 22 - 32 mmol/L 24  29  29    Calcium  8.9 - 10.3 mg/dL 9.5  9.4  9.7   Total Protein 6.5 - 8.1 g/dL 7.9  7.0  7.7   Total Bilirubin 0.0 - 1.2 mg/dL 0.4  0.4  0.4   Alkaline Phos 38 - 126 U/L 101  77  78   AST 15 - 41 U/L 37  15  18   ALT 0 - 44 U/L 25  13  17      RADIOGRAPHIC STUDIES: No results found.  ASSESSMENT & PLAN Charles [name] 63 y.o. male with medical history significant  for castrate sensitive prostate cancer who presents for a follow up visit.  # Castrate Sensitive Prostate Cancer --last dose of Eligard  on 08/12/2022 at 45 mg (6 month dose). Eligard  shot due today (every 6 month) but patient insurance ahs changed and we are not able to get this approved.  Will have him return to clinic to have the shot. -- at every visit will obtain labs to include CMP, CBC, and PSA -- If the PSA is elevated from prior would need to pursue a new baseline set of imaging.  Consider PSMA scan versus CT chest abdomen pelvis -- continue Zytiga  1000 mg p.o. daily with prednisone  5 mg daily.   --Last PSA was <0.1 at last check on 11/30/2022.  --labs today show white blood cell *** -- Plan to have the patient return to clinic in 3 months time or sooner if there are concerning findings in the above lab work/imaging.  #ED -- Patient notes Viagra  50 mg p.o. was ineffective.  Recommend he increase to 100 mg p.o. as needed.  Precautions for lightheadedness and operating heavy machinery  # Healthcare Maintenance -- Patient has requested a referral to her primary care.  Will make referral to [place] PCP -- Due to elevated blood pressure will prescribe amlodipine  5 mg p.o. daily.  No orders of the defined types were placed in this encounter.   All questions were answered. The patient knows to call the clinic with any problems, questions or concerns.  A total of more than 30 minutes were spent on this encounter with face-to-face time and non-face-to-face time, including preparing to see the patient, ordering tests and/or medications, counseling the patient and coordination of care as outlined above.   Rogerio Clay, MD Department of Hematology/Oncology [facility] Phone: [phone] Pager: [phone] Email: [email or web address]  06/29/2023 12:49 PM

## 2023-06-30 ENCOUNTER — Telehealth: Payer: Self-pay | Admitting: Hematology and Oncology

## 2023-06-30 ENCOUNTER — Inpatient Hospital Stay: Payer: Self-pay

## 2023-06-30 ENCOUNTER — Inpatient Hospital Stay: Payer: Self-pay | Admitting: Hematology and Oncology

## 2023-08-18 ENCOUNTER — Encounter: Payer: Self-pay | Admitting: Hematology and Oncology

## 2023-08-30 ENCOUNTER — Encounter: Payer: Self-pay | Admitting: Hematology and Oncology

## 2023-08-30 ENCOUNTER — Encounter: Payer: Self-pay | Admitting: Podiatry

## 2023-08-30 ENCOUNTER — Ambulatory Visit: Payer: Self-pay | Admitting: Podiatry

## 2023-08-30 ENCOUNTER — Ambulatory Visit (INDEPENDENT_AMBULATORY_CARE_PROVIDER_SITE_OTHER)

## 2023-08-30 VITALS — Ht 66.0 in | Wt 210.0 lb

## 2023-08-30 DIAGNOSIS — M2042 Other hammer toe(s) (acquired), left foot: Secondary | ICD-10-CM

## 2023-08-30 DIAGNOSIS — M779 Enthesopathy, unspecified: Secondary | ICD-10-CM

## 2023-08-30 NOTE — Progress Notes (Signed)
 Subjective:   Patient ID: Charles [name], male   DOB: 63 y.o.   MRN: [id number]   HPI Patient states he has had a very painful fifth digit left foot that has been present for several years and gradually more irritated.  States that he has tried wider shoes he has tried trimming it and using over-the-counter medication without relief and it is sore all the time now.  Patient does not smoke likes to be active   Review of Systems  All other systems reviewed and are negative.       Objective:  Physical Exam Vitals and nursing note reviewed.  Constitutional:      Appearance: He is well-developed.  Pulmonary:     Effort: Pulmonary effort is normal.   Musculoskeletal:        General: Normal range of motion.   Skin:    General: Skin is warm.   Neurological:     Mental Status: He is alert.     Neurovascular status intact muscle strength found to be adequate range of motion within normal limits.  Patient is found to have a painful fifth digit left with keratotic lesion and rotation of the toe.  Patient is found to have good digital perfusion well oriented x 3     Assessment:  Chronic hammertoe deformity digit 5 left with pain and failure to respond conservatively to numerous treatments     Plan:  H&P x-ray taken reviewed discussed condition at great length.  Patient has tried trimming techniques and wants to get this back in due to the discomfort and at this point I went ahead and I reviewed with him arthroplasty digit 5 left and I allowed him to read and signed consent form for correction.  Patient understands there is no guarantee as far as success of surgery understands risk and that recovery will take several months.  Scheduled for surgery and we [facility] to do this in the office to reduce cost and he will have it done in the next several weeks with all questions answered today  X-ray indicates significant rotation digit 5 left with enlargement of the head of the proximal  phalanx

## 2023-08-31 ENCOUNTER — Telehealth: Payer: Self-pay | Admitting: Podiatry

## 2023-08-31 NOTE — Telephone Encounter (Signed)
 Received surgical consent forms fo in office surgery..  Left message for pt to see if he could do it on 7/16 and to call me to discuss further.

## 2023-09-06 ENCOUNTER — Telehealth: Payer: Self-pay | Admitting: Podiatry

## 2023-09-06 NOTE — Telephone Encounter (Signed)
 Left message for pt to call to schedule the office surgery with Dr Magdalen for possibly 7/16.

## 2023-09-17 ENCOUNTER — Telehealth: Payer: Self-pay | Admitting: Podiatry

## 2023-09-17 NOTE — Telephone Encounter (Signed)
 Pt called and was looking into scheduling his surgery. He is an office surgery. I explained we usually do those on Monday's or Thursdays and he is going to look at his work calendar and let me know. I did tell him to pick a couple of days and we will see which one we can get to work for both of us . He is to call me back.

## 2023-11-22 ENCOUNTER — Encounter: Payer: Self-pay | Admitting: Hematology and Oncology

## 2024-01-03 ENCOUNTER — Ambulatory Visit: Admitting: Podiatry
